# Patient Record
Sex: Female | Born: 1980
Health system: Southern US, Community
[De-identification: ages and names within clinical notes are randomized; demographics above are authoritative.]

## PROBLEM LIST (undated history)

## (undated) ENCOUNTER — Inpatient Hospital Stay (HOSPITAL_COMMUNITY): Payer: Self-pay

## (undated) DIAGNOSIS — H50811 Duane's syndrome, right eye: Secondary | ICD-10-CM

## (undated) DIAGNOSIS — E669 Obesity, unspecified: Secondary | ICD-10-CM

## (undated) DIAGNOSIS — F419 Anxiety disorder, unspecified: Secondary | ICD-10-CM

## (undated) DIAGNOSIS — M26629 Arthralgia of temporomandibular joint, unspecified side: Secondary | ICD-10-CM

## (undated) DIAGNOSIS — D72829 Elevated white blood cell count, unspecified: Secondary | ICD-10-CM

## (undated) DIAGNOSIS — F329 Major depressive disorder, single episode, unspecified: Secondary | ICD-10-CM

## (undated) DIAGNOSIS — D134 Benign neoplasm of liver: Secondary | ICD-10-CM

## (undated) DIAGNOSIS — K219 Gastro-esophageal reflux disease without esophagitis: Secondary | ICD-10-CM

## (undated) DIAGNOSIS — M654 Radial styloid tenosynovitis [de Quervain]: Secondary | ICD-10-CM

## (undated) DIAGNOSIS — F32A Depression, unspecified: Secondary | ICD-10-CM

## (undated) HISTORY — DX: Anxiety disorder, unspecified: F41.9

## (undated) HISTORY — DX: Obesity, unspecified: E66.9

## (undated) HISTORY — PX: WISDOM TOOTH EXTRACTION: SHX21

## (undated) HISTORY — DX: Elevated white blood cell count, unspecified: D72.829

## (undated) HISTORY — PX: STRABISMUS SURGERY: SHX218

## (undated) HISTORY — DX: Benign neoplasm of liver: D13.4

---
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
## 2002-03-07 ENCOUNTER — Other Ambulatory Visit: Admission: RE | Admit: 2002-03-07 | Discharge: 2002-03-07 | Payer: Self-pay | Admitting: Obstetrics and Gynecology

## 2003-03-27 ENCOUNTER — Other Ambulatory Visit: Admission: RE | Admit: 2003-03-27 | Discharge: 2003-03-27 | Payer: Self-pay | Admitting: Obstetrics and Gynecology

## 2004-04-08 ENCOUNTER — Other Ambulatory Visit: Admission: RE | Admit: 2004-04-08 | Discharge: 2004-04-08 | Payer: Self-pay | Admitting: Obstetrics and Gynecology

## 2005-06-27 ENCOUNTER — Emergency Department (HOSPITAL_COMMUNITY): Admission: EM | Admit: 2005-06-27 | Discharge: 2005-06-27 | Payer: Self-pay | Admitting: *Deleted

## 2005-07-01 ENCOUNTER — Other Ambulatory Visit: Admission: RE | Admit: 2005-07-01 | Discharge: 2005-07-01 | Payer: Self-pay | Admitting: Obstetrics and Gynecology

## 2006-02-07 ENCOUNTER — Emergency Department (HOSPITAL_COMMUNITY): Admission: EM | Admit: 2006-02-07 | Discharge: 2006-02-07 | Payer: Self-pay | Admitting: Family Medicine

## 2006-09-01 ENCOUNTER — Emergency Department (HOSPITAL_COMMUNITY): Admission: EM | Admit: 2006-09-01 | Discharge: 2006-09-01 | Payer: Self-pay | Admitting: Family Medicine

## 2006-09-10 ENCOUNTER — Other Ambulatory Visit: Admission: RE | Admit: 2006-09-10 | Discharge: 2006-09-10 | Payer: Self-pay | Admitting: Obstetrics & Gynecology

## 2007-09-01 HISTORY — PX: UPPER GASTROINTESTINAL ENDOSCOPY: SHX188

## 2007-11-24 ENCOUNTER — Other Ambulatory Visit: Admission: RE | Admit: 2007-11-24 | Discharge: 2007-11-24 | Payer: Self-pay | Admitting: Obstetrics and Gynecology

## 2011-09-16 ENCOUNTER — Encounter: Payer: Self-pay | Admitting: Internal Medicine

## 2011-09-17 ENCOUNTER — Institutional Professional Consult (permissible substitution): Payer: Self-pay | Admitting: Internal Medicine

## 2011-09-17 ENCOUNTER — Encounter: Payer: Self-pay | Admitting: Internal Medicine

## 2011-09-17 ENCOUNTER — Ambulatory Visit (INDEPENDENT_AMBULATORY_CARE_PROVIDER_SITE_OTHER): Payer: 59 | Admitting: Internal Medicine

## 2011-09-17 VITALS — BP 124/80 | HR 118 | Temp 98.1°F | Ht 62.0 in | Wt 240.8 lb

## 2011-09-17 DIAGNOSIS — J310 Chronic rhinitis: Secondary | ICD-10-CM

## 2011-09-17 DIAGNOSIS — R05 Cough: Secondary | ICD-10-CM

## 2011-09-17 MED ORDER — PREDNISONE (PAK) 10 MG PO TABS: ORAL_TABLET | ORAL | Status: AC

## 2011-09-17 MED ORDER — TRAMADOL HCL 50 MG PO TABS: ORAL_TABLET | ORAL | Status: AC

## 2011-09-17 NOTE — Patient Instructions (Addendum)
The key to effective treatment for your cough is eliminating the non-stop cycle of cough you're stuck in long enough to let your airway heal completely and then see if there is anything still making you cough once you stop the cough suppression, but this should take no more than 5 days to figuure out  First take delsym two tsp every 12 hours and supplement if needed with  tramadol 50 mg up to 2 every 4 hours to suppress the urge to cough. Swallowing water or using ice chips/non mint and menthol containing candies (such as lifesavers or sugarless jolly ranchers) are also effective.  You should rest your voice and avoid activities that you know make you cough.  Once you have eliminated the cough for 3 straight days try reducing the tramadol first,  then the delsym as tolerated.    Try prilosec 20mg   Take x 2  30-60 min before first meal of the day and Pepcid 20 mg one bedtime until cough is completely gone for at least a week without the need for cough suppression  I think of reflux for chronic cough like I do oxygen for fire (doesn't cause the fire but once you get the oxygen suppressed it usually goes away regardless of the exact cause).  GERD (REFLUX)  is an extremely common cause of respiratory symptoms, many times with no significant heartburn at all.    It can be treated with medication, but also with lifestyle changes including avoidance of late meals, excessive alcohol, smoking cessation, and avoid fatty foods, chocolate, peppermint, colas, red wine, and acidic juices such as orange juice.  NO MINT OR MENTHOL PRODUCTS SO NO COUGH DROPS  USE SUGARLESS CANDY INSTEAD (jolley ranchers or Stover's)  NO OIL BASED VITAMINS - use powdered substitutes.    Prednisone 10 mg take  4 each am x 2 days,   2 each am x 2 days,  1 each am x2days and stop     If not better in 2 weeks the next step is a sinus CT

## 2011-09-17 NOTE — Progress Notes (Signed)
  Subjective:    Patient ID: Melissa May, female    DOB: 10/14/1980, 31 y.o.   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  09/17/2011 1st pulmonary eval cc acute onset 08/2011  Mostly dry cough  - no diurnal  pattern to cough vs husband who says worse sev hours p supper until 4 am but doesn't awaken her. Assoc with nasal congestion and sneezing, no purulent sputum.  Responded to prednisone maybe 50% but caused shaking p 9 days so stopped.    She says she's sleeping ok without  need for noct saba. Also denies any obvious fluctuation of symptoms with weather or environmental changes or other aggravating or alleviating factors except as outlined above - still has dogs inside the house and bedroom despite Dr Kathyrn Lass recs.  .  Review of Systems  Constitutional: Negative for fever, chills and unexpected weight change.  HENT: Positive for ear pain, congestion and sneezing. Negative for nosebleeds, sore throat, rhinorrhea, trouble swallowing, dental problem, voice change, postnasal drip and sinus pressure.   Eyes: Negative for visual disturbance.  Respiratory: Positive for cough and shortness of breath. Negative for choking.   Cardiovascular: Positive for chest pain. Negative for leg swelling.  Gastrointestinal: Negative for vomiting, abdominal pain and diarrhea.  Genitourinary: Negative for difficulty urinating.  Musculoskeletal: Negative for arthralgias.  Skin: Negative for rash.  Neurological: Positive for headaches. Negative for tremors and syncope.  Hematological: Does not  bruise/bleed easily.       Objective:   Physical Exam  amb obese moderately anxious wf nad  Wt 240 09/17/11   HEENT: nl dentition, turbinates, and orophanx. Nl external ear canals without cough reflex   NECK :  without JVD/Nodes/TM/ nl carotid upstrokes bilaterally   LUNGS: no acc muscle use, clear to A and P bilaterally without cough on insp or exp maneuvers   CV:  RRR  no s3 or murmur or increase in P2, no edema   ABD:  soft and nontender with nl excursion in the supine position. No bruits or organomegaly, bowel sounds nl  MS:  warm without deformities, calf tenderness, cyanosis or clubbing  SKIN: warm and dry without lesions    NEURO:  alert, approp, no deficits         Assessment & Plan:

## 2011-09-18 DIAGNOSIS — J310 Chronic rhinitis: Secondary | ICD-10-CM | POA: Insufficient documentation

## 2011-09-18 NOTE — Assessment & Plan Note (Signed)
The most common causes of chronic cough in immunocompetent adults include the following: upper airway cough syndrome (UACS), previously referred to as postnasal drip syndrome (PNDS), which is caused by variety of rhinosinus conditions; (2) asthma; (3) GERD; (4) chronic bronchitis from cigarette smoking or other inhaled environmental irritants; (5) nonasthmatic eosinophilic bronchitis; and (6) bronchiectasis.   These conditions, singly or in combination, have accounted for up to 94% of the causes of chronic cough in prospective studies.   Other conditions have constituted no >6% of the causes in prospective studies These have included bronchogenic carcinoma, chronic interstitial pneumonia, sarcoidosis, left ventricular failure, ACEI-induced cough, and aspiration from a condition associated with pharyngeal dysfunction.   This is most c/w  Classic Upper airway cough syndrome, so named because it's frequently impossible to sort out how much is  CR/sinusitis with freq throat clearing (which can be related to primary GERD)   vs  causing  secondary (" extra esophageal")  GERD from wide swings in gastric pressure that occur with throat clearing, often  promoting self use of mint and menthol lozenges that reduce the lower esophageal sphincter tone and exacerbate the problem further in a cyclical fashion.   These are the same pts who not infrequently have failed to tolerate ace inhibitors,  dry powder inhalers or biphosphonates or report having reflux symptoms that don't respond to standard doses of PPI , and are easily confused as having aecopd or asthma flares,   For now max rx for gerd/ cyclical cough then regroup

## 2011-09-18 NOTE — Assessment & Plan Note (Signed)
Advised not appropriate to have dogs in house esp in bedroom or to take singulair prn (needs to either prove it works or stop it completely)

## 2012-05-20 DIAGNOSIS — E669 Obesity, unspecified: Secondary | ICD-10-CM | POA: Insufficient documentation

## 2012-11-24 ENCOUNTER — Ambulatory Visit (INDEPENDENT_AMBULATORY_CARE_PROVIDER_SITE_OTHER): Payer: 59 | Admitting: Family Medicine

## 2012-11-24 VITALS — BP 128/92 | HR 86 | Temp 98.2°F | Resp 16 | Ht 63.0 in | Wt 238.0 lb

## 2012-11-24 DIAGNOSIS — R21 Rash and other nonspecific skin eruption: Secondary | ICD-10-CM

## 2012-11-24 DIAGNOSIS — L259 Unspecified contact dermatitis, unspecified cause: Secondary | ICD-10-CM

## 2012-11-24 DIAGNOSIS — L309 Dermatitis, unspecified: Secondary | ICD-10-CM

## 2012-11-24 LAB — POCT SKIN KOH: Skin KOH, POC: NEGATIVE

## 2012-11-24 MED ORDER — BETAMETHASONE DIPROPIONATE 0.05 % EX CREA: TOPICAL_CREAM | Freq: Two times a day (BID) | CUTANEOUS | Status: DC

## 2012-11-24 NOTE — Progress Notes (Signed)
Subjective: Patient has had a rash in her right leg for about 3 weeks. It is a fairly well-circumscribed area about 3 seems in diameter. It does not itch a lot. Just very apparent. She's going on a cruise next week and would like it to be gone.  Objective: Erythematous area, well-defined, 3 seems in diameter, right mid shin. It does not have a very active looking for her. It is fairly homogenous. No other lesions on her.  Assessment: Eczematoid dermatitis  Plan: Betamethasone topical. Return if not improved

## 2012-11-24 NOTE — Patient Instructions (Signed)
Return if it is not improving.  Use the cream twice daily.

## 2012-12-19 ENCOUNTER — Telehealth: Payer: Self-pay | Admitting: Nurse Practitioner

## 2012-12-19 NOTE — Telephone Encounter (Signed)
LMTCB  aa 

## 2012-12-19 NOTE — Telephone Encounter (Signed)
Yes lets have pt come in for OV she is seeing infertility specialist and Dr. Hyacinth Meeker - so I would defer her irregular bleeding for Dr. Hyacinth Meeker to evaluate.

## 2012-12-19 NOTE — Telephone Encounter (Signed)
Advised patient of Patty's recommendation for office visit.  Patient states she stopped fertility treatements last summer. Agrees to OV but needs early AM or late PM.  Appt scheduled with Dr Farrel Gobble on Wed at 1115.  Best available appt for patient schedule.

## 2012-12-19 NOTE — Telephone Encounter (Signed)
Spoke with pt who is having her third period in 30 days. Had period from March 22 thru 26. Had another from April 8-12, and started again on April 18. Pt not on birth control and has not been for over a year. Please advise. OV?

## 2012-12-21 ENCOUNTER — Encounter: Payer: Self-pay | Admitting: Gynecology

## 2012-12-21 ENCOUNTER — Ambulatory Visit (INDEPENDENT_AMBULATORY_CARE_PROVIDER_SITE_OTHER): Payer: 59 | Admitting: Gynecology

## 2012-12-21 VITALS — BP 120/76 | HR 82 | Resp 18 | Ht 62.0 in | Wt 240.0 lb

## 2012-12-21 DIAGNOSIS — N970 Female infertility associated with anovulation: Secondary | ICD-10-CM | POA: Insufficient documentation

## 2012-12-21 DIAGNOSIS — N920 Excessive and frequent menstruation with regular cycle: Secondary | ICD-10-CM | POA: Insufficient documentation

## 2012-12-21 LAB — TSH: TSH: 1.942 u[IU]/mL (ref 0.350–4.500)

## 2012-12-21 LAB — PROLACTIN: Prolactin: 4.7 ng/mL

## 2012-12-21 LAB — FSH/LH
FSH: 8.2 m[IU]/mL
LH: 2.9 m[IU]/mL

## 2012-12-21 LAB — HCG, SERUM, QUALITATIVE: Preg, Serum: NEGATIVE

## 2012-12-21 NOTE — Patient Instructions (Signed)
Use condoms until sonohysterogram/biopsy doneDysfunctional Uterine Bleeding Normally, menstrual periods begin between ages 32 to 34 in young women. A normal menstrual cycle/period may begin every 23 days up to 35 days and lasts from 1 to 7 days. Around 12 to 14 days before your menstrual period starts, ovulation (ovary produces an egg) occurs. When counting the time between menstrual periods, count from the first day of bleeding of the previous period to the first day of bleeding of the next period. Dysfunctional (abnormal) uterine bleeding is bleeding that is different from a normal menstrual period. Your periods may come earlier or later than usual. They may be lighter, have blood clots or be heavier. You may have bleeding between periods, or you may skip one period or more. You may have bleeding after sexual intercourse, bleeding after menopause, or no menstrual period. CAUSES   Pregnancy (normal, miscarriage, tubal).  IUDs (intrauterine device, birth control).  Birth control pills.  Hormone treatment.  Menopause.  Infection of the cervix.  Blood clotting problems.  Infection of the inside lining of the uterus.  Endometriosis, inside lining of the uterus growing in the pelvis and other female organs.  Adhesions (scar tissue) inside the uterus.  Obesity or severe weight loss.  Uterine polyps inside the uterus.  Cancer of the vagina, cervix, or uterus.  Ovarian cysts or polycystic ovary syndrome.  Medical problems (diabetes, thyroid disease).  Uterine fibroids (noncancerous tumor).  Problems with your female hormones.  Endometrial hyperplasia, very thick lining and enlarged cells inside of the uterus.  Medicines that interfere with ovulation.  Radiation to the pelvis or abdomen.  Chemotherapy. DIAGNOSIS   Your doctor will discuss the history of your menstrual periods, medicines you are taking, changes in your weight, stress in your life, and any medical problems you  may have.  Your doctor will do a physical and pelvic examination.  Your doctor may want to perform certain tests to make a diagnosis, such as:  Pap test.  Blood tests.  Cultures for infection.  CT scan.  Ultrasound.  Hysteroscopy.  Laparoscopy.  MRI.  Hysterosalpingography.  D and C.  Endometrial biopsy. TREATMENT  Treatment will depend on the cause of the dysfunctional uterine bleeding (DUB). Treatment may include:  Observing your menstrual periods for a couple of months.  Prescribing medicines for medical problems, including:  Antibiotics.  Hormones.  Birth control pills.  Removing an IUD (intrauterine device, birth control).  Surgery:  D and C (scrape and remove tissue from inside the uterus).  Laparoscopy (examine inside the abdomen with a lighted tube).  Uterine ablation (destroy lining of the uterus with electrical current, laser, heat, or freezing).  Hysteroscopy (examine cervix and uterus with a lighted tube).  Hysterectomy (remove the uterus). HOME CARE INSTRUCTIONS   If medicines were prescribed, take exactly as directed. Do not change or switch medicines without consulting your caregiver.  Long term heavy bleeding may result in iron deficiency. Your caregiver may have prescribed iron pills. They help replace the iron that your body lost from heavy bleeding. Take exactly as directed.  Do not take aspirin or medicines that contain aspirin one week before or during your menstrual period. Aspirin may make the bleeding worse.  If you need to change your sanitary pad or tampon more than once every 2 hours, stay in bed with your feet elevated and a cold pack on your lower abdomen. Rest as much as possible, until the bleeding stops or slows down.  Eat well-balanced meals. Eat foods  high in iron. Examples are:  Leafy green vegetables.  Whole-grain breads and cereals.  Eggs.  Meat.  Liver.  Do not try to lose weight until the abnormal  bleeding has stopped and your blood iron level is back to normal. Do not lift more than ten pounds or do strenuous activities when you are bleeding.  For a couple of months, make note on your calendar, marking the start and ending of your period, and the type of bleeding (light, medium, heavy, spotting, clots or missed periods). This is for your caregiver to better evaluate your problem. SEEK MEDICAL CARE IF:   You develop nausea (feeling sick to your stomach) and vomiting, dizziness, or diarrhea while you are taking your medicine.  You are getting lightheaded or weak.  You have any problems that may be related to the medicine you are taking.  You develop pain with your DUB.  You want to remove your IUD.  You want to stop or change your birth control pills or hormones.  You have any type of abnormal bleeding mentioned above.  You are over 4 years old and have not had a menstrual period yet.  You are 32 years old and you are still having menstrual periods.  You have any of the symptoms mentioned above.  You develop a rash. SEEK IMMEDIATE MEDICAL CARE IF:   An oral temperature above 102 F (38.9 C) develops.  You develop chills.  You are changing your sanitary pad or tampon more than once an hour.  You develop abdominal pain.  You pass out or faint. Document Released: 08/14/2000 Document Revised: 11/09/2011 Document Reviewed: 07/16/2009 [facility] Patient Information 2013 [place], [place].

## 2012-12-21 NOTE — Progress Notes (Signed)
Subjective:     Patient ID: Melissa May, female   DOB: 14-Jun-1981, 32 y.o.   MRN: [id number]  HPI Comments: Pt here to discuss hypermenorrhea.  Pt was undergoing infertility evaluation at [facility], she underwent clomid cycles and then Femara cycles, last cycle induced October 2013.  Pt reports regular cycles in November through March.  Pt reports thereafter she had a cycle March 1-5, then March 22-26, April 8-12, April 18-current.  Pt reports spotting in between cycles.  Pt denies post coital bleeding.  Pt reports [facility] felt she is not PCOS, but perhaps prediabetic. Currently pt and husband are taking a break.      Review of Systems  Endocrine: Negative for cold intolerance, heat intolerance, polydipsia and polyuria.  All other systems reviewed and are negative.       Objective:   Physical Exam  Constitutional: She appears well-developed and well-nourished.  Abdominal: Soft.  obese  Pelvic exam:  VULVA: normal appearing vulva with no masses, tenderness or lesions  VAGINA: menstrum, no malodor,  CERVIX: normal appearing cervix without lesions,  UTERUS: uterus is normal size, shape, consistency and nontender,  ADNEXA: no masses.      Assessment:     Hypermenorrhea, history of infertility     Plan:     We reviewed her records- recommend sonohysterogram, poss EMB TSH, PRL, QUAL, FSH/LH to be done Pt instructed to use condoms until u/s evaluation, repeat urine pregnancy at that vist Recommend Pt's husband give another sperm sample to rule out varicocele-2% normal morphology Pt given emotional support

## 2013-01-09 ENCOUNTER — Telehealth: Payer: Self-pay | Admitting: Obstetrics & Gynecology

## 2013-01-09 NOTE — Telephone Encounter (Signed)
Pt cancelled ultrasound for Tuesday and would like to schedule for the 20th if possible.

## 2013-01-10 ENCOUNTER — Other Ambulatory Visit: Payer: 59 | Admitting: Gynecology

## 2013-01-10 ENCOUNTER — Other Ambulatory Visit: Payer: 59

## 2013-01-11 NOTE — Telephone Encounter (Signed)
LMTCB to resched appt.

## 2013-01-16 ENCOUNTER — Telehealth: Payer: Self-pay | Admitting: Obstetrics and Gynecology

## 2013-01-16 NOTE — Telephone Encounter (Signed)
Patient actually has two accounts.  Will have charts merged.  Note to Dr Farrel Gobble

## 2013-01-16 NOTE — Telephone Encounter (Signed)
LM regarding changed date for [facility], asked to call back

## 2013-01-16 NOTE — Telephone Encounter (Signed)
See next telephone call  

## 2013-01-16 NOTE — Telephone Encounter (Signed)
Patient states she is not able to get coverage for [employer] tomm and want to schedule for 01-24-13.  LMP 01-05-13 and advised patient  That this would be too late in her cycle for this test.  Patient is insistent that she has not had intercourse and no chance of pregnancy and that this test is being done for frequent irregular periods. I asked her if she was still attempting pregnancy ([facility] referral in old chart) and she states "it is a long story"  Advised patient I could not schedule SHGM past mid-cycle without checking with MD so will have to call her back.  * I can not find any notation that we ordered this.   Paper chart to your office.

## 2013-01-16 NOTE — Telephone Encounter (Signed)
Patient returned phone call to [facility].

## 2013-01-17 ENCOUNTER — Encounter: Payer: Self-pay | Admitting: Gynecology

## 2013-01-17 ENCOUNTER — Telehealth: Payer: Self-pay | Admitting: Gynecology

## 2013-01-17 NOTE — Telephone Encounter (Signed)
Patient called in reference to hysterectomy.

## 2013-01-19 NOTE — Telephone Encounter (Signed)
Routed to triage -Melissa May

## 2013-01-24 ENCOUNTER — Other Ambulatory Visit: Payer: Self-pay | Admitting: Gynecology

## 2013-01-24 DIAGNOSIS — N920 Excessive and frequent menstruation with regular cycle: Secondary | ICD-10-CM

## 2013-01-31 ENCOUNTER — Ambulatory Visit (INDEPENDENT_AMBULATORY_CARE_PROVIDER_SITE_OTHER): Payer: 59 | Admitting: Gynecology

## 2013-01-31 ENCOUNTER — Ambulatory Visit (INDEPENDENT_AMBULATORY_CARE_PROVIDER_SITE_OTHER): Payer: 59

## 2013-01-31 DIAGNOSIS — N949 Unspecified condition associated with female genital organs and menstrual cycle: Secondary | ICD-10-CM

## 2013-01-31 DIAGNOSIS — N938 Other specified abnormal uterine and vaginal bleeding: Secondary | ICD-10-CM

## 2013-01-31 DIAGNOSIS — N8300 Follicular cyst of ovary, unspecified side: Secondary | ICD-10-CM

## 2013-01-31 DIAGNOSIS — N920 Excessive and frequent menstruation with regular cycle: Secondary | ICD-10-CM

## 2013-01-31 MED ORDER — NORETHINDRONE-ETH ESTRADIOL 0.4-35 MG-MCG PO TABS: 1.0000 | ORAL_TABLET | Freq: Every day | ORAL | Status: DC

## 2013-01-31 NOTE — Telephone Encounter (Signed)
Left message for patient to return call to office to reschedule ultra sound and sonohysterography  due to her bleeding . Per Dr. Farrel Gobble reschedule for June 10th. sue

## 2013-01-31 NOTE — Progress Notes (Signed)
  Pt here for u/s with [facility] for DUB, the pt reports menometrorrhagia and is greatly distressed regarding this, she had started spotting this am but not enough for a tampon, she reports this is typical for her.  She and her husband have been actively trying to conceive for 2y without success and she is feeling pressure as she is going to be 32yo soon. She reports they have not had sex in over a month in anticipation of this test today, in addition, her husband is going thru training at work which makes him unavailable.  Pt recently started on Prozac by another provider and is feeling better but reports less sex drive.   Her LMP is questionable because of the frequent spotting that she has. We reviewed her u/s images as well as in real time, of note is she is indeed D12? Her endometrium is weakly trilayered and 4.84mm Pt informed polyp unlikely with such thin lining but would like to proceed with SHG. Consent obtained, cevix cleansed with betadine insemination catheter splaced, walls gently distended, no intracavitary defects noted. We reviewed the patient's chart regarding fertility, labs were done at [facility] but the results are NA.  Pt states that she was started on metformin but does not have PCOS.  I explained that her labs would help make that determination, but while she may have ovulated her other follicles appear larger than expected.  We discussed the impact of PCOS both on ovulation induction as well as the effects of elevated LH on CVD.  We have asked her to release labs to [place], and she is agreeable.  Her husband's SA was abnormal and in the past we had suggested he be evaluated by Urology as he has only 2% normal morphology with a concentration of 21m.  We discussed the impact of different stimulating agents on the endometrium and multiple gestations.  Regarding her break-thru bleeding, there were no gross defects noted and it could be related to poor estrogen from poor ovulation,  We suggested  trying to increase her lining with a pill while her husband is being evaluated.  If indeed his sperm remains at low levels of normal morpholgy, they might benefit from IVF/ICSI and her stimulation for that would correct her poor endometrial response. All questions were addressed Length of consultation beyond u/s, SHG 49m

## 2013-01-31 NOTE — Telephone Encounter (Signed)
Patient called and talked with Dr. Farrel Gobble concerning appt. Today.  Patient will keep appt. Today for ultrasound and sonahystagram. States she is not bleeding enough to even have to use tampon.

## 2013-01-31 NOTE — Telephone Encounter (Signed)
Patient called and wanted to know if she could still do her procedure because she has started bleeding

## 2013-01-31 NOTE — Telephone Encounter (Signed)
Left message of need to return our call to our office to reschedule appt. For next week.

## 2013-01-31 NOTE — Telephone Encounter (Signed)
Patient has a procedure scheduled for today and has started bleeding . Needs to know if she needs to still come in or if she needs to reschedule.

## 2013-04-14 ENCOUNTER — Encounter: Payer: Self-pay | Admitting: Gynecology

## 2013-04-14 ENCOUNTER — Ambulatory Visit (INDEPENDENT_AMBULATORY_CARE_PROVIDER_SITE_OTHER): Payer: 59 | Admitting: Gynecology

## 2013-04-14 VITALS — BP 114/66 | HR 64 | Resp 12 | Ht 62.0 in | Wt 238.0 lb

## 2013-04-14 DIAGNOSIS — Z01419 Encounter for gynecological examination (general) (routine) without abnormal findings: Secondary | ICD-10-CM

## 2013-04-14 DIAGNOSIS — Z124 Encounter for screening for malignant neoplasm of cervix: Secondary | ICD-10-CM

## 2013-04-14 DIAGNOSIS — N949 Unspecified condition associated with female genital organs and menstrual cycle: Secondary | ICD-10-CM

## 2013-04-14 DIAGNOSIS — N469 Male infertility, unspecified: Secondary | ICD-10-CM

## 2013-04-14 DIAGNOSIS — N970 Female infertility associated with anovulation: Secondary | ICD-10-CM

## 2013-04-14 DIAGNOSIS — N938 Other specified abnormal uterine and vaginal bleeding: Secondary | ICD-10-CM

## 2013-04-14 MED ORDER — NORETHINDRONE-ETH ESTRADIOL 0.4-35 MG-MCG PO TABS: 1.0000 | ORAL_TABLET | Freq: Every day | ORAL | Status: DC

## 2013-04-14 NOTE — Progress Notes (Signed)
Pt here for issues regarding fertility.  32 y.o. Married Caucasian female   G0P0 here for annual exam. Pt is currently sexually active.   Pt was undergoing infertility evaluation at [facility], she underwent clomid cycles and then Femara cycles, last cycle induced October 2013. Pt reports regular cycles in November through March. Pt reports thereafter she had a cycle March 1-5, then March 22-26, April 8-12, April 18-current. Pt reports spotting in between cycles. Pt denies post coital bleeding. Pt reports [facility] felt she is not PCOS, but perhaps prediabetic. She had an u/s and SHG 6/5 for DUB Pt's husband give another sperm sample to rule out varicocele-2% normal morphology, was recently-evaluated by urology-4%normal, low Testosterone, to be started on clomid, records pending   Patient's last menstrual period was 04/01/2013.          Sexually active: yes  The current method of family planning is OCP (estrogen/progesterone).    Exercising: yes Last pap: 03/10/12- Negative Alcohol: occasionally once/month Tobacco: no BSE: No      Health Maintenance  Topic Date Due  . Pap Smear  03/02/2001  . Tetanus/tdap  03/02/2002  . Influenza Vaccine  05/01/2013    Family History  Problem Relation Age of Onset  . Cancer Father   . Hyperlipidemia    . Hypertension    . Asthma Paternal Grandmother   . Diabetes Paternal Grandmother   . Allergies Brother   . Kidney cancer Father   . Hypertension Father   . Arthritis Mother   . Hypertension Maternal Grandfather     Patient Active Problem List   Diagnosis Date Noted  . Infertility associated with anovulation 12/21/2012  . Hypermenorrhea 12/21/2012  . Chronic rhinitis 09/18/2011  . Cough 09/17/2011    Past Medical History  Diagnosis Date  . Viral infection   . Pharyngitis   . Asthma   . Multiple allergies   . Allergy   . Asthma   . Abnormal uterine bleeding     2 weeks of bleeding in March and April of 2014  . Dysmenorrhea      Past Surgical History  Procedure Laterality Date  . Upper gastrointestinal endoscopy  2011    Allergies: Lexapro oral solution  Current Outpatient Prescriptions  Medication Sig Dispense Refill  . cetirizine (ZYRTEC) 10 MG tablet Take 10 mg by mouth daily.      . famotidine (PEPCID) 20 MG tablet Take 20 mg by mouth daily.      Marland Kitchen FLUoxetine (PROZAC) 40 MG capsule Take 40 mg by mouth daily.      . norethindrone-ethinyl estradiol (OVCON-35,BALZIVA,BRIELLYN) 0.4-35 MG-MCG tablet Take 1 tablet by mouth daily.  1 Package  12  . omeprazole (PRILOSEC) 20 MG capsule Take 20 mg by mouth daily.      . Prenatal Multivit-Min-Fe-FA (PRE-NATAL PO) Take by mouth daily.       No current facility-administered medications for this visit.    ROS: Pertinent items are noted in HPI.  Exam:    BP 114/66  Pulse 64  Resp 12  Ht 5\' 2"  (1.575 m)  Wt 238 lb (107.956 kg)  BMI 43.52 kg/m2  LMP 04/01/2013 Weight change: @WEIGHTCHANGE @ Last 3 height recordings:  Ht Readings from Last 3 Encounters:  04/14/13 5\' 2"  (1.575 m)  12/21/12 5\' 2"  (1.575 m)  11/24/12 5\' 3"  (1.6 m)   General appearance: alert, cooperative and appears stated age Head: Normocephalic, without obvious abnormality, atraumatic Neck: no adenopathy, no carotid bruit, no JVD, supple, symmetrical,  trachea midline and thyroid not enlarged, symmetric, no tenderness/mass/nodules Lungs: clear to auscultation bilaterally Breasts: normal appearance, no masses or tenderness Heart: regular rate and rhythm, S1, S2 normal, no murmur, click, rub or gallop Abdomen: soft, non-tender; bowel sounds normal; no masses,  no organomegaly Extremities: extremities normal, atraumatic, no cyanosis or edema Skin: Skin color, texture, turgor normal. No rashes or lesions Lymph nodes: Cervical, supraclavicular, and axillary nodes normal. no inguinal nodes palpated Neurologic: Grossly normal   Pelvic: External genitalia:  no lesions              Urethra:  normal appearing urethra with no masses, tenderness or lesions              Bartholins and Skenes: normal                 Vagina: normal appearing vagina with normal color and discharge, no lesions              Cervix: normal appearance              Pap taken: yes        Bimanual Exam:  Uterus:  uterus is normal size, shape, consistency and nontender                                      Adnexa:    no masses                                      Rectovaginal: Confirms                                      Anus:  normal sphincter tone, no lesions  A: well woman Contraceptive management     P: pap smear with HPV counseled on breast self exam Discussed low sperm count and effects on fertility, would like to see the initial and follow up SA after clomid.  Briefly discussed options for stimulation, pt with poor endometrial response to clomid, might  Benefit from IUI by REI or ICSI.  Pt asked to call to confirm we received SA results.  Husband does not have varicocele.  Would like more formal discussion after results available and seh agrees. DUB improving on OCP will continue until we determine stimulation  PNV suggested Additional 90m spend discussing fertility return annually or prn   An After Visit Summary was printed and given to the patient.

## 2013-04-14 NOTE — Patient Instructions (Signed)

## 2013-04-18 LAB — IPS PAP TEST WITH HPV

## 2013-05-14 ENCOUNTER — Encounter (HOSPITAL_COMMUNITY): Payer: Self-pay | Admitting: *Deleted

## 2013-05-14 ENCOUNTER — Emergency Department (HOSPITAL_COMMUNITY)
Admission: EM | Admit: 2013-05-14 | Discharge: 2013-05-14 | Disposition: A | Discharge status: Home / Self Care | Payer: Commercial Managed Care - PPO | Source: Home / Self Care | Attending: Family Medicine | Admitting: Family Medicine

## 2013-05-14 DIAGNOSIS — J029 Acute pharyngitis, unspecified: Secondary | ICD-10-CM

## 2013-05-14 HISTORY — DX: Depression, unspecified: F32.A

## 2013-05-14 HISTORY — DX: Gastro-esophageal reflux disease without esophagitis: K21.9

## 2013-05-14 HISTORY — DX: Major depressive disorder, single episode, unspecified: F32.9

## 2013-05-14 LAB — POCT RAPID STREP A: Streptococcus, Group A Screen (Direct): NEGATIVE

## 2013-05-14 MED ORDER — PREDNISONE 20 MG PO TABS: 20.0000 mg | ORAL_TABLET | Freq: Every day | ORAL | Status: DC

## 2013-05-14 NOTE — ED Notes (Signed)
C/O sore throat, slight cough, watery ear drainage, swimmy feeling in head since Friday.  Sore throat becoming worse - "feels like I've swallowed razor blades".  Has tried Tyl cold & Congestion.

## 2013-05-14 NOTE — ED Provider Notes (Signed)
Melissa May is a 32 y.o. female who presents to Urgent Care today for sore throat and congestion present for the last 2 days. It has worsened today. She's tried over-the-counter Tylenol cold medications which have helped only a little. She denies any fevers or chills nausea vomiting diarrhea or trouble breathing. She feels well otherwise. The sore throat is quite bothersome.    Past Medical History  Diagnosis Date  . Viral infection   . Pharyngitis   . Asthma   . Multiple allergies   . Allergy   . Asthma   . Abnormal uterine bleeding     2 weeks of bleeding in March and April of 2014  . Dysmenorrhea   . GERD (gastroesophageal reflux disease)   . Depression    History  Substance Use Topics  . Smoking status: Never Smoker   . Smokeless tobacco: Never Used  . Alcohol Use: 1.2 oz/week    1 Glasses of wine, 1 Cans of beer per week     Comment: occasionally   ROS as above Medications reviewed. No current facility-administered medications for this encounter.   Current Outpatient Prescriptions  Medication Sig Dispense Refill  . cetirizine (ZYRTEC) 10 MG tablet Take 10 mg by mouth daily.      . famotidine (PEPCID) 20 MG tablet Take 20 mg by mouth daily.      Marland Kitchen FLUoxetine (PROZAC) 40 MG capsule Take 40 mg by mouth daily.      . norethindrone-ethinyl estradiol (OVCON-35,BALZIVA,BRIELLYN) 0.4-35 MG-MCG tablet Take 1 tablet by mouth daily.  3 Package  4  . predniSONE (DELTASONE) 20 MG tablet Take 1 tablet (20 mg total) by mouth daily.  5 tablet  0    Exam:  BP 131/86  Pulse 90  Temp(Src) 99.1 F (37.3 C) (Oral)  Resp 18  SpO2 96%  LMP 04/23/2013 Gen: Well NAD HEENT: EOMI,  MMM, normal-appearing tympanic membranes bilaterally. Posterior for his mildly erythematous.  Lungs: CTABL Nl WOB Heart: RRR no MRG Abd: NABS, NT, ND Exts: Non edematous BL  LE, warm and well perfused.   Results for orders placed during the hospital encounter of 05/14/13 (from the past 24 hour(s))  POCT  RAPID STREP A ([facility] ONLY)     Status: None   Collection Time    05/14/13  6:54 PM      Result Value Range   Streptococcus, Group A Screen (Direct) NEGATIVE  NEGATIVE   No results found.  Assessment and Plan: 32 y.o. female with viral pharyngitis. Patient is very symptomatic. Will use a short course of low dose prednisone for symptom relief. Additionally Tylenol as needed for pain. Discussed warning signs or symptoms. Please see discharge instructions. Patient expresses understanding. F/u PRN     Rodolph Bong, MD 05/14/13 1900

## 2013-05-15 ENCOUNTER — Telehealth: Payer: Self-pay | Admitting: Gynecology

## 2013-05-15 NOTE — Telephone Encounter (Signed)
Patient is wanting to know if we have received her husband semen results.

## 2013-05-15 NOTE — Telephone Encounter (Signed)
LMTCB

## 2013-05-16 ENCOUNTER — Ambulatory Visit (INDEPENDENT_AMBULATORY_CARE_PROVIDER_SITE_OTHER): Payer: 59 | Admitting: Physician Assistant

## 2013-05-16 VITALS — BP 122/84 | HR 87 | Temp 99.0°F | Resp 17 | Ht 63.0 in | Wt 236.0 lb

## 2013-05-16 DIAGNOSIS — R059 Cough, unspecified: Secondary | ICD-10-CM

## 2013-05-16 DIAGNOSIS — R05 Cough: Secondary | ICD-10-CM

## 2013-05-16 DIAGNOSIS — J329 Chronic sinusitis, unspecified: Secondary | ICD-10-CM

## 2013-05-16 MED ORDER — HYDROCODONE-HOMATROPINE 5-1.5 MG/5ML PO SYRP: 5.0000 mL | ORAL_SOLUTION | Freq: Three times a day (TID) | ORAL | Status: DC | PRN

## 2013-05-16 MED ORDER — AMOXICILLIN-POT CLAVULANATE 875-125 MG PO TABS: 1.0000 | ORAL_TABLET | Freq: Two times a day (BID) | ORAL | Status: DC

## 2013-05-16 NOTE — Progress Notes (Signed)
  Subjective:    Patient ID: Melissa May, female    DOB: 1980-12-01, 32 y.o.   MRN: [id number]  HPI 32 year old female presents for evaluation of sinus congestion, rhinorrhea, dry, hacking cough, and right sided sinus pain.  Was seen at [facility] UC on 9/14 and told she had a viral URI and given a taper of prednisone. She has been taking this plus OTC Dayquil/Nyquil which does not seem to be helping.  Admits symptoms have progressively worsened and now that she has severe right sided facial pain decided to come in for a recheck.  Does have a hx of sinus infection and seasonal allergies. She takes Zyrtec daily which has helped decrease the frequency of her infections - has not had one in over a year.  Has uses nasal sprays in the past that have not helped much.   Denies fever, chills, nausea, vomiting, SOB, wheezing, hemoptysis, otalgia, or sore throat.  Patient is otherwise doing well with no other concerns today.      Review of Systems  Constitutional: Negative for fever and chills.  HENT: Positive for congestion, rhinorrhea, postnasal drip and sinus pressure (right side). Negative for ear pain and sore throat.   Respiratory: Positive for cough. Negative for shortness of breath and wheezing.   Gastrointestinal: Negative for nausea, vomiting and abdominal pain.  Neurological: Negative for dizziness and headaches.       Objective:   Physical Exam  Constitutional: She is oriented to person, place, and time. She appears well-developed and well-nourished.  HENT:  Head: Normocephalic and atraumatic.  Right Ear: Hearing, tympanic membrane, external ear and ear canal normal.  Left Ear: Hearing, tympanic membrane, external ear and ear canal normal.  Nose: Right sinus exhibits maxillary sinus tenderness. Right sinus exhibits no frontal sinus tenderness. Left sinus exhibits no maxillary sinus tenderness and no frontal sinus tenderness.  Mouth/Throat: Uvula is midline, oropharynx is clear and moist  and mucous membranes are normal.  Eyes: Conjunctivae are normal.  Neck: Normal range of motion. Neck supple.  Cardiovascular: Normal rate, regular rhythm and normal heart sounds.   Pulmonary/Chest: Effort normal and breath sounds normal.  Lymphadenopathy:    She has no cervical adenopathy.  Neurological: She is alert and oriented to person, place, and time.  Psychiatric: She has a normal mood and affect. Her behavior is normal. Judgment and thought content normal.          Assessment & Plan:  Sinus infection - Plan: amoxicillin-clavulanate (AUGMENTIN) 875-125 MG per tablet  Cough - Plan: HYDROcodone-homatropine (HYCODAN) 5-1.5 MG/5ML syrup  Start Augmentin 875 mg bid x 10 days Finish prednisone taper Hycodan qhs prn cough Increase fluids and rest Follow up if symptoms worsen or fail to improve.

## 2013-05-16 NOTE — Telephone Encounter (Signed)
Left message to call office again. Results are not in office. To verify from patient when returns call, where were the labs done?

## 2013-05-17 ENCOUNTER — Telehealth (HOSPITAL_COMMUNITY): Payer: Self-pay | Admitting: Family Medicine

## 2013-05-17 LAB — CULTURE, GROUP A STREP

## 2013-05-17 NOTE — ED Notes (Signed)
Called regarding lab results. Left a message Asked to call back.  Continue ABX.  F/u PRN  Rodolph Bong, MD 05/17/13 1315

## 2013-05-17 NOTE — Telephone Encounter (Signed)
Patient has the results of her husband test wants to bring them by.

## 2013-05-18 ENCOUNTER — Telehealth (HOSPITAL_COMMUNITY): Payer: Self-pay | Admitting: *Deleted

## 2013-05-18 NOTE — ED Notes (Addendum)
I called and left a message to call.  Call 2. Melissa May 05/18/2013 Pt. called back.  Pt. verified x 2 and given results.  Pt. instructed to complete all of the Augmentin. States she got from the [facility]. She went there on 9/16 because her face was swollen.  She said Dr. Denyse Amass had left the results on her VM. I told her I did not know, because his note just said he left a message.She said she was doing much better.

## 2013-05-23 NOTE — Telephone Encounter (Signed)
Call to patient to check on results.  She states she dropped them off on Thursday and is waiting to hear from Dr Farrel Gobble.

## 2013-05-24 NOTE — Telephone Encounter (Signed)
Pt returning call to nurse

## 2013-05-26 NOTE — Telephone Encounter (Signed)
Phone message to Dr Farrel Gobble (who is out of office) on Wed 05-24-13.  She will contact patient regarding results.

## 2013-05-27 ENCOUNTER — Telehealth: Payer: Self-pay | Admitting: Gynecology

## 2013-05-27 NOTE — Telephone Encounter (Signed)
Spoke with patient regarding her husband's recent seman analysis, he has been started on clomid for low count and testosterone.  They have female and female factor infertility. Review of the 2 semans shows an increase in total volume from 1.5cc to 2.4cc and an increase in # to 145m but concentration is unchanged.  Pt informed that he had 4% normals with many head and mid defects, his testosterone remains low but prior value is not available so unknown if this is an improvement. I had suggested based on the low % of normal sperm that she might benefit from being treated by REI and might benefit from either IUI or maybe IVF/ICSI but suggest that she discuss this with REI.  I did not feel that she would be best served from my stimulating her with clomid and timed coitus. Questions addressed. She has the name of an REI she was referred to by friend. Best wished given

## 2013-06-28 ENCOUNTER — Telehealth: Payer: Self-pay | Admitting: Gynecology

## 2013-06-28 NOTE — Telephone Encounter (Signed)
Patient is seeing Dr. Laural Benes at [facility] in [place] for Infertility. Patient called to schedule an HSG that Dr. Laural Benes ordered for her. I advised that we send our patients to [facility] hospital for this type of scan. Patient is agreeable. Monday, Wednesday or Friday works best for patient. Patient said order was faxed to Dr. Farrel Gobble from Dr. Laural Benes. Patient is going to call with her cycle so we can schedule her for a HSG during days 7-10.

## 2013-07-06 ENCOUNTER — Other Ambulatory Visit: Payer: Self-pay | Admitting: Gynecology

## 2013-07-06 ENCOUNTER — Ambulatory Visit (INDEPENDENT_AMBULATORY_CARE_PROVIDER_SITE_OTHER): Payer: 59 | Admitting: Family Medicine

## 2013-07-06 VITALS — BP 110/72 | HR 95 | Temp 97.9°F | Resp 18 | Ht 63.0 in | Wt 238.8 lb

## 2013-07-06 DIAGNOSIS — R05 Cough: Secondary | ICD-10-CM

## 2013-07-06 DIAGNOSIS — J019 Acute sinusitis, unspecified: Secondary | ICD-10-CM

## 2013-07-06 DIAGNOSIS — N970 Female infertility associated with anovulation: Secondary | ICD-10-CM

## 2013-07-06 DIAGNOSIS — R059 Cough, unspecified: Secondary | ICD-10-CM

## 2013-07-06 DIAGNOSIS — N920 Excessive and frequent menstruation with regular cycle: Secondary | ICD-10-CM

## 2013-07-06 MED ORDER — AZITHROMYCIN 250 MG PO TABS: ORAL_TABLET | ORAL | Status: DC

## 2013-07-06 MED ORDER — IPRATROPIUM BROMIDE 0.03 % NA SOLN: 2.0000 | Freq: Two times a day (BID) | NASAL | Status: DC

## 2013-07-06 MED ORDER — ALBUTEROL SULFATE HFA 108 (90 BASE) MCG/ACT IN AERS: 2.0000 | INHALATION_SPRAY | Freq: Four times a day (QID) | RESPIRATORY_TRACT | Status: DC | PRN

## 2013-07-06 MED ORDER — DOXYCYCLINE HYCLATE 100 MG PO CAPS: 100.0000 mg | ORAL_CAPSULE | Freq: Two times a day (BID) | ORAL | Status: DC

## 2013-07-06 NOTE — Progress Notes (Signed)
[facility]:  Office Visit  Chief Complaint:  Chief Complaint  Patient presents with  . Cough    since Monday  . ears blocked  . Nasal Congestion  . Fatigue    HPI: Melissa May is a 32 y.o. female who is here for cold symptoms. For the last 4 days she has had a productive green cough, headache, fatigue, chest tightness, sinus drainage, and stuffy nose. She has a h/o asthma and has had to use albuterol but not really for wheezing. SHe doe snot feel like it is in her chest, she feels it is more in face, no SOB SH ehas allergies She has tried Day Quil and Nyquil OTC for the last couple days. She has not notice any fever. She has chills at times.  She is trying to get pregnant  Past Medical History  Diagnosis Date  . Viral infection   . Pharyngitis   . Asthma   . Multiple allergies   . Allergy   . Asthma   . Abnormal uterine bleeding     2 weeks of bleeding in March and April of 2014  . Dysmenorrhea   . GERD (gastroesophageal reflux disease)   . Depression    Past Surgical History  Procedure Laterality Date  . Upper gastrointestinal endoscopy  2011   History   Social History  . Marital Status: Married    Spouse Name: N/A    Number of Children: 0  . Years of Education: N/A   Occupational History  . Therapist    Social History Main Topics  . Smoking status: Never Smoker   . Smokeless tobacco: Never Used  . Alcohol Use: 1.2 oz/week    1 Glasses of wine, 1 Cans of beer per week     Comment: occasionally  . Drug Use: No  . Sexual Activity: Yes    Birth Control/ Protection: Pill   Other Topics Concern  . None   Social History Narrative   ** Merged History Encounter **       Family History  Problem Relation Age of Onset  . Cancer Father   . Hyperlipidemia    . Hypertension    . Asthma Paternal Grandmother   . Diabetes Paternal Grandmother   . Allergies Brother   . Kidney cancer Father   . Hypertension Father   . Arthritis Mother    . Hypertension Maternal Grandfather    Allergies  Allergen Reactions  . Lexapro Oral Solution [Escitalopram Oxalate]     GI upset and irritability   Prior to Admission medications   Medication Sig Start Date End Date Taking? Authorizing Provider  cetirizine (ZYRTEC) 10 MG tablet Take 10 mg by mouth daily.   Yes Historical Provider, MD  famotidine (PEPCID) 20 MG tablet Take 20 mg by mouth daily.   Yes Historical Provider, MD  FLUoxetine (PROZAC) 40 MG capsule Take 40 mg by mouth daily.   Yes Historical Provider, MD  amoxicillin-clavulanate (AUGMENTIN) 875-125 MG per tablet Take 1 tablet by mouth 2 (two) times daily. 05/16/13   Heather Jaquita Rector, PA-C  doxycycline (VIBRAMYCIN) 100 MG capsule Take 1 capsule (100 mg total) by mouth 2 (two) times daily. begin night before HSG 07/06/13   Bennye Alm, MD  HYDROcodone-homatropine [facility]) 5-1.5 MG/5ML syrup Take 5 mLs by mouth every 8 (eight) hours as needed for cough. 05/16/13   Heather Jaquita Rector, PA-C  norethindrone-ethinyl estradiol (OVCON-35,BALZIVA,BRIELLYN) 0.4-35 MG-MCG tablet Take 1 tablet by mouth daily.  04/14/13   Bennye Alm, MD  predniSONE (DELTASONE) 20 MG tablet Take 1 tablet (20 mg total) by mouth daily. 05/14/13   Rodolph Bong, MD     ROS: The patient denies fevers, chills, night sweats, unintentional weight loss, chest pain, palpitations, wheezing, dyspnea on exertion, nausea, vomiting, abdominal pain, dysuria, hematuria, melena, numbness, weakness, or tingling.   All other systems have been reviewed and were otherwise negative with the exception of those mentioned in the HPI and as above.    PHYSICAL EXAM: Filed Vitals:   07/06/13 1746  BP: 110/72  Pulse: 95  Temp: 97.9 F (36.6 C)  Resp: 18   Filed Vitals:   07/06/13 1746  Height: 5\' 3"  (1.6 m)  Weight: 238 lb 12.8 oz (108.319 kg)   Body mass index is 42.31 kg/(m^2).  General: Alert, no acute distress HEENT:  Normocephalic, atraumatic, oropharynx patent. EOMI,  PERRLA + sinus tenderness, boggy nares, TM nl, no exudates Cardiovascular:  Regular rate and rhythm, no rubs murmurs or gallops.  No Carotid bruits, radial pulse intact. No pedal edema.  Respiratory: Clear to auscultation bilaterally.  No wheezes, rales, or rhonchi.  No cyanosis, no use of accessory musculature GI: No organomegaly, abdomen is soft and non-tender, positive bowel sounds.  No masses. Skin: No rashes. Neurologic: Facial musculature symmetric. Psychiatric: Patient is appropriate throughout our interaction. Lymphatic: No cervical lymphadenopathy Musculoskeletal: Gait intact.   LABS: Results for orders placed during the hospital encounter of 05/14/13  CULTURE, GROUP A STREP      Result Value Range   Specimen Description THROAT     Special Requests ADDED 1932     Culture       Value: STREPTOCOCCUS,BETA HEMOLYIC NOT GROUP A     Performed at [facility]   Report Status 05/17/2013 FINAL    POCT RAPID STREP A ([facility] ONLY)      Result Value Range   Streptococcus, Group A Screen (Direct) NEGATIVE  NEGATIVE     EKG/XRAY:   Primary read interpreted by Dr. Conley Rolls at [facility].   ASSESSMENT/PLAN: Encounter Diagnoses  Name Primary?  . Acute sinusitis Yes  . Cough    Rx  Azithromycin ( had diarrhea with augmetin) She is tryign to get pregnant Rx Atrovent Rx Albuterol Otc cough medicine F/u prn  Gross sideeffects, risk and benefits, and alternatives of medications d/w patient. Patient is aware that all medications have potential sideeffects and we are unable to predict every sideeffect or drug-drug interaction that may occur.  LE, THAO PHUONG, DO 07/06/2013 7:00 PM

## 2013-07-06 NOTE — Patient Instructions (Signed)

## 2013-07-07 ENCOUNTER — Telehealth: Payer: Self-pay | Admitting: *Deleted

## 2013-07-07 NOTE — Telephone Encounter (Signed)
S/w patient she said she went to Urgent Care and already picked up Doxycycline along with her other medications that were sent in to her pharmacy and is aware to take the Doxycycline the night before her test. She said that Monday would make the 8th day of her cycle. She asked if we would need to call over to [facility] and set up appointment or she could just walk in. S/w Dr. Farrel Gobble she says that patient can wait until Monday for test and that all she would need to do is just call over and let them know it was time for her HSG, patient notified and aware.

## 2013-07-07 NOTE — Telephone Encounter (Signed)
Message copied by Lorraine Lax on Fri Jul 07, 2013  9:36 AM ------      Message from: Douglass Rivers      Created: Thu Jul 06, 2013  9:32 AM       i dropped the order for her HSG and added doxycycline which i always give to start the night before the test, i could not tell if they ordered it, can you call her and let her know that [facility] and about the doxy  ------

## 2013-07-07 NOTE — Telephone Encounter (Signed)
Can we just make sure that she is scheduled for Monday?

## 2013-07-07 NOTE — Telephone Encounter (Signed)
[facility]. She is not on scheduled for HSG.  I do not have a recent LMP. Message left to return call to [employer] at [phone].

## 2013-07-10 NOTE — Telephone Encounter (Signed)
Message left to return call to Simone Rodenbeck at [phone].    

## 2013-07-10 NOTE — Telephone Encounter (Signed)
Spoke with patient. States LMP 10/19-10/22. Should start next cycle on  11/17 per patient. [facility] at [phone]. They need patient to call on first day of LMP. Will need to have exam days 7-10. Left message to advise of message for patient, she will need to call [facility] to schedule on first day of cycle.

## 2013-07-11 ENCOUNTER — Other Ambulatory Visit: Payer: Self-pay | Admitting: Family Medicine

## 2013-07-11 ENCOUNTER — Telehealth: Payer: Self-pay

## 2013-07-11 MED ORDER — AMOXICILLIN 500 MG PO CAPS: 500.0000 mg | ORAL_CAPSULE | Freq: Two times a day (BID) | ORAL | Status: DC

## 2013-07-11 NOTE — Telephone Encounter (Signed)
LM she can pick up new rx, she has had diarrhea with Augmentin so willl do Amox 500 mg BID x 10 days.

## 2013-07-11 NOTE — Telephone Encounter (Signed)
PT STATES DR LE TOLD HER IF NO BETTER, SHE WOULD CALL HER IN A DIFFERENT ANTIBIOTIC. PLEASE CALL [phone]   [facility] IF TODAY OR AFTERWARD TO [facility] [address]

## 2013-07-24 ENCOUNTER — Ambulatory Visit (HOSPITAL_COMMUNITY)
Admission: RE | Admit: 2013-07-24 | Discharge: 2013-07-24 | Disposition: A | Discharge status: Home / Self Care | Payer: 59 | Source: Ambulatory Visit | Attending: Gynecology | Admitting: Gynecology

## 2013-07-24 DIAGNOSIS — N920 Excessive and frequent menstruation with regular cycle: Secondary | ICD-10-CM

## 2013-07-24 DIAGNOSIS — N979 Female infertility, unspecified: Secondary | ICD-10-CM | POA: Insufficient documentation

## 2013-07-24 DIAGNOSIS — N970 Female infertility associated with anovulation: Secondary | ICD-10-CM

## 2013-07-24 MED ORDER — IOHEXOL 300 MG/ML  SOLN
20.0000 mL | Freq: Once | INTRAMUSCULAR | Status: AC | PRN
  Administered 2013-07-24: 6 mL

## 2013-08-08 ENCOUNTER — Encounter (HOSPITAL_BASED_OUTPATIENT_CLINIC_OR_DEPARTMENT_OTHER): Payer: Self-pay | Admitting: *Deleted

## 2013-08-14 ENCOUNTER — Telehealth: Payer: Self-pay | Admitting: Gynecology

## 2013-08-14 NOTE — Telephone Encounter (Signed)
Spoke with patient. She has called and cancelled her eye surgery for tomorrow and OV with Dr. Farrel Gobble scheduled for 12/16.

## 2013-08-14 NOTE — Telephone Encounter (Signed)
Message left to return call to Melissa May at [phone].    

## 2013-08-14 NOTE — Telephone Encounter (Signed)
Patient says she had a positive pregnancy test over the weekend. Patient says she is supposed to have surgery tomorrow.

## 2013-08-15 ENCOUNTER — Ambulatory Visit (INDEPENDENT_AMBULATORY_CARE_PROVIDER_SITE_OTHER): Payer: 59 | Admitting: Gynecology

## 2013-08-15 ENCOUNTER — Ambulatory Visit (HOSPITAL_BASED_OUTPATIENT_CLINIC_OR_DEPARTMENT_OTHER): Admission: RE | Admit: 2013-08-15 | Payer: 59 | Source: Ambulatory Visit | Admitting: Ophthalmology

## 2013-08-15 VITALS — BP 102/70 | HR 66 | Resp 12 | Ht 62.0 in

## 2013-08-15 DIAGNOSIS — N912 Amenorrhea, unspecified: Secondary | ICD-10-CM

## 2013-08-15 HISTORY — DX: Arthralgia of temporomandibular joint, unspecified side: M26.629

## 2013-08-15 HISTORY — DX: Duane's syndrome, right eye: H50.811

## 2013-08-15 SURGERY — REPAIR STRABISMUS
Anesthesia: General | Site: Eye | Laterality: Right

## 2013-08-15 NOTE — Progress Notes (Signed)
Subjective:     Patient ID: Melissa May, female   DOB: 03/06/1981, 32 y.o.   MRN: [id number]  HPI Comments: Pt here with husband for +UPT at home.  Pt had been referred to [facility] for female and female factor infertility.  Her husband has been on clomid for low testosterone and low normal sperm count with multiple defects. Pt just had HSG which was normal.  Pt never started at [facility].  LMP 07/17/13.  Pt is taking otc prenatal vitamins-chewable.  Pt reports mastalgia and mild nausea.  Pt was on ocp until October and stopped in anticipation of fertility treatments.   They are both astatic with pregnancy.     Review of Systems  Genitourinary: Negative for vaginal bleeding, vaginal pain and pelvic pain.       Objective:   Physical Exam  Nursing note and vitals reviewed. Constitutional: She is oriented to person, place, and time. She appears well-developed and well-nourished.  Neurological: She is alert and oriented to person, place, and time.       Assessment:     +UPT with long standing infertility     Plan:     congratulations given. Questions addressed regarding medications, activities, diet.  We had tried to stop pt's prozac when beginning fertility treatments but were unsuccessful.  Pt is aware that it is cat C and if benefits outweigh risks, she should continue.  Pt would prefer to continue. She is about 4w today, we will get quant today and repeat in 48h.  Plan for viability u/s at 6w. Instructed to call for pain or bleeding 73m spent >50% face to face, counseling for early pregnancy

## 2013-08-15 NOTE — Patient Instructions (Signed)
Meds: tylenol, robitussin, benadryl, pepcid, axid, nexium, gass-x Avoid acid-tomatos, juices, spicy, fried, broccoli, cabbage, cold cuts. No high order fish-tuna, mahi, sword, grouper-mercury issue No bire, goat, feta or blue-pasturized cheeses 1/2 unisom with vit b6 for nausea, ginger [place] dry-  Please call for vaginal bleeding or spotting or pain

## 2013-08-16 LAB — ABO AND RH: Rh Type: NEGATIVE

## 2013-08-18 ENCOUNTER — Telehealth: Payer: Self-pay | Admitting: *Deleted

## 2013-08-21 NOTE — Telephone Encounter (Signed)
Pt returning call

## 2013-08-22 ENCOUNTER — Other Ambulatory Visit (INDEPENDENT_AMBULATORY_CARE_PROVIDER_SITE_OTHER): Payer: 59

## 2013-08-22 ENCOUNTER — Other Ambulatory Visit: Payer: Self-pay | Admitting: Gynecology

## 2013-08-22 ENCOUNTER — Telehealth: Payer: Self-pay | Admitting: Emergency Medicine

## 2013-08-22 DIAGNOSIS — N912 Amenorrhea, unspecified: Secondary | ICD-10-CM

## 2013-08-22 NOTE — Telephone Encounter (Signed)
Spoke with patient. She will come today and have hcg drawn. Lab appointment scheduled.

## 2013-08-22 NOTE — Telephone Encounter (Signed)
Dr. Farrel Gobble requested that I call patient to assist with scheduling her next lab draw hcg qualitative. Message left to return call to [place] at [phone]. Lab released so she can have lab drawn at her location of choice.

## 2013-08-22 NOTE — Addendum Note (Signed)
Addended by: Lorraine Lax on: 08/22/2013 11:54 AM   Modules accepted: Orders

## 2013-08-23 LAB — HCG, QUANTITATIVE, PREGNANCY: hCG, Beta Chain, Quant, S: 9612.2 m[IU]/mL

## 2013-08-28 ENCOUNTER — Ambulatory Visit (INDEPENDENT_AMBULATORY_CARE_PROVIDER_SITE_OTHER): Payer: 59 | Admitting: Obstetrics & Gynecology

## 2013-08-28 ENCOUNTER — Other Ambulatory Visit: Payer: Self-pay | Admitting: Obstetrics & Gynecology

## 2013-08-28 ENCOUNTER — Other Ambulatory Visit: Payer: Self-pay | Admitting: *Deleted

## 2013-08-28 ENCOUNTER — Inpatient Hospital Stay: Payer: Self-pay

## 2013-08-28 ENCOUNTER — Ambulatory Visit (HOSPITAL_COMMUNITY)
Admission: RE | Admit: 2013-08-28 | Discharge: 2013-08-28 | Disposition: A | Discharge status: Home / Self Care | Payer: 59 | Source: Ambulatory Visit | Attending: Obstetrics & Gynecology | Admitting: Obstetrics & Gynecology

## 2013-08-28 ENCOUNTER — Encounter: Payer: Self-pay | Admitting: Obstetrics & Gynecology

## 2013-08-28 ENCOUNTER — Inpatient Hospital Stay (HOSPITAL_COMMUNITY)
Admission: AD | Admit: 2013-08-28 | Discharge: 2013-08-28 | Disposition: A | Discharge status: Home / Self Care | Payer: 59 | Source: Ambulatory Visit | Attending: Gynecology | Admitting: Gynecology

## 2013-08-28 VITALS — BP 98/60 | HR 76 | Resp 16 | Wt 241.0 lb

## 2013-08-28 DIAGNOSIS — O200 Threatened abortion: Secondary | ICD-10-CM

## 2013-08-28 DIAGNOSIS — Z2989 Encounter for other specified prophylactic measures: Secondary | ICD-10-CM | POA: Insufficient documentation

## 2013-08-28 DIAGNOSIS — Z298 Encounter for other specified prophylactic measures: Secondary | ICD-10-CM | POA: Insufficient documentation

## 2013-08-28 DIAGNOSIS — O99891 Other specified diseases and conditions complicating pregnancy: Secondary | ICD-10-CM | POA: Insufficient documentation

## 2013-08-28 DIAGNOSIS — O209 Hemorrhage in early pregnancy, unspecified: Secondary | ICD-10-CM

## 2013-08-28 DIAGNOSIS — N920 Excessive and frequent menstruation with regular cycle: Secondary | ICD-10-CM

## 2013-08-28 MED ORDER — RHO D IMMUNE GLOBULIN 300 MCG IM INJ: 300.0000 ug | INJECTION | Freq: Once | INTRAMUSCULAR | Status: DC

## 2013-08-28 MED ORDER — RHO D IMMUNE GLOBULIN 1500 UNIT/2ML IJ SOLN
300.0000 ug | Freq: Once | INTRAMUSCULAR | Status: AC
  Administered 2013-08-28: 300 ug via INTRAMUSCULAR
  Filled 2013-08-28: qty 2

## 2013-08-28 NOTE — Telephone Encounter (Signed)
Advised pharmacy to dc that order to that location. Patient is at [facility] hospital at this time and order is in [facility] for hospital dispensing. Dr. Hyacinth Meeker aware.

## 2013-08-28 NOTE — Telephone Encounter (Signed)
Pharmacy calling saying something about a prescription that was just sent to them.  rho,D, immune globulin (HYPERHO S/D) 300 MCG INJ injection  Inject 300 mcg into the muscle once., Starting 08/28/2013, Normal, Last Dose: Not Recorded  Refills: 0 ordered Pharmacy:  [facility] - [place], [place] - [address]

## 2013-08-28 NOTE — Progress Notes (Signed)
Subjective:     Patient ID: Melissa May, female   DOB: 02-Sep-1980, 32 y.o.   MRN: [id number]  HPI 32 yo G1P0 MWF here with first trimester bleeding/spotting.  Pt with hx of infertility and seen at [facility].  Has done Clomid and was getting ready to start IUI's.  She conceived on her own between these treatments.  HCGs have been rising appropriately with HCG12/16 of 723 and then on 12/23 9612.  Pt comes in today with complaint of vaginal spotting, bright red, every time she has a bowel movement.  She is having some generalized lower pelvic cramping as well as a little bit of right sided pain.  This is mild.  Together, however, this has [name] very anxious.  Mild nausea and fatigue present as well.  Review of Systems  All other systems reviewed and are negative.       Objective:   Physical Exam  Constitutional: She appears well-developed and well-nourished.  Abdominal: Soft. Bowel sounds are normal. She exhibits no distension and no mass. There is no tenderness. There is no rebound and no guarding.  Genitourinary: There is no rash or tenderness on the right labia. There is no rash or tenderness on the left labia. Uterus is enlarged (8 weeks in size and full). Uterus is not deviated and not tender. Cervix exhibits friability (with very ectocervical transition zone.  Bleeding appears to be from ectocervic and not from os.). Cervix exhibits no discharge. Right adnexum displays no mass and no tenderness. Left adnexum displays no mass and no tenderness. No bleeding (but only light and streaky looking) around the vagina. No vaginal discharge found.  Musculoskeletal: Normal range of motion.  Lymphadenopathy:       Right: No inguinal adenopathy present.       Left: No inguinal adenopathy present.  Neurological: She is alert.  Skin: Skin is warm and dry.  Psychiatric: She has a normal mood and affect.       Assessment:     Threatened abortion MBT O neg (pt has blood donor card with her today)      Plan:     Plan viability scan.  Pt would like to do this at [facility] hospital today if possible. Rhogam work-up and administration at [facility].  Called there personally and orders given.

## 2013-08-29 LAB — RH IG WORKUP (INCLUDES ABO/RH)
ABO/RH(D): O NEG
Gestational Age(Wks): 6

## 2013-08-29 NOTE — Progress Notes (Signed)
No PUS scheduled or order to be canceled.

## 2013-09-06 ENCOUNTER — Telehealth: Payer: Self-pay | Admitting: Gynecology

## 2013-09-06 ENCOUNTER — Encounter (HOSPITAL_COMMUNITY): Payer: Self-pay

## 2013-09-06 ENCOUNTER — Inpatient Hospital Stay (HOSPITAL_COMMUNITY)
Admission: AD | Admit: 2013-09-06 | Discharge: 2013-09-06 | Disposition: A | Discharge status: Home / Self Care | Payer: 59 | Source: Ambulatory Visit | Attending: Obstetrics & Gynecology | Admitting: Obstetrics & Gynecology

## 2013-09-06 DIAGNOSIS — O9989 Other specified diseases and conditions complicating pregnancy, childbirth and the puerperium: Principal | ICD-10-CM

## 2013-09-06 DIAGNOSIS — O210 Mild hyperemesis gravidarum: Secondary | ICD-10-CM | POA: Insufficient documentation

## 2013-09-06 DIAGNOSIS — A088 Other specified intestinal infections: Secondary | ICD-10-CM | POA: Insufficient documentation

## 2013-09-06 DIAGNOSIS — O99891 Other specified diseases and conditions complicating pregnancy: Secondary | ICD-10-CM | POA: Insufficient documentation

## 2013-09-06 DIAGNOSIS — R197 Diarrhea, unspecified: Secondary | ICD-10-CM | POA: Insufficient documentation

## 2013-09-06 DIAGNOSIS — A084 Viral intestinal infection, unspecified: Secondary | ICD-10-CM

## 2013-09-06 DIAGNOSIS — E860 Dehydration: Secondary | ICD-10-CM | POA: Insufficient documentation

## 2013-09-06 LAB — BASIC METABOLIC PANEL
BUN: 5 mg/dL — ABNORMAL LOW (ref 6–23)
CALCIUM: 8.9 mg/dL (ref 8.4–10.5)
CO2: 24 mEq/L (ref 19–32)
CREATININE: 0.52 mg/dL (ref 0.50–1.10)
Chloride: 101 mEq/L (ref 96–112)
GFR calc Af Amer: >90 mL/min (ref >90)
GLUCOSE: 95 mg/dL (ref 70–99)
Potassium: 3.9 mEq/L (ref 3.7–5.3)
Sodium: 138 mEq/L (ref 137–147)

## 2013-09-06 LAB — URINALYSIS, ROUTINE W REFLEX MICROSCOPIC
BILIRUBIN URINE: NEGATIVE
Glucose, UA: NEGATIVE mg/dL
Hgb urine dipstick: NEGATIVE
Ketones, ur: 15 mg/dL — AB
Leukocytes, UA: NEGATIVE
NITRITE: NEGATIVE
PROTEIN: NEGATIVE mg/dL
Specific Gravity, Urine: 1.025 (ref 1.005–1.030)
UROBILINOGEN UA: 0.2 mg/dL (ref 0.0–1.0)
pH: 5.5 (ref 5.0–8.0)

## 2013-09-06 LAB — CBC
HCT: 37.3 % (ref 36.0–46.0)
HEMOGLOBIN: 12.5 g/dL (ref 12.0–15.0)
MCH: 30.7 pg (ref 26.0–34.0)
MCHC: 33.5 g/dL (ref 30.0–36.0)
MCV: 91.6 fL (ref 78.0–100.0)
PLATELETS: 327 10*3/uL (ref 150–400)
RBC: 4.07 MIL/uL (ref 3.87–5.11)
RDW: 12.9 % (ref 11.5–15.5)
WBC: 11.9 10*3/uL — ABNORMAL HIGH (ref 4.0–10.5)

## 2013-09-06 MED ORDER — PROMETHAZINE HCL 12.5 MG PO TABS
12.5000 mg | ORAL_TABLET | Freq: Four times a day (QID) | ORAL | Status: DC | PRN
Start: 2013-09-06 — End: 2014-04-15

## 2013-09-06 MED ORDER — PROMETHAZINE HCL 25 MG/ML IJ SOLN
25.0000 mg | Freq: Once | INTRAVENOUS | Status: AC
  Administered 2013-09-06: 25 mg via INTRAVENOUS
  Filled 2013-09-06: qty 1

## 2013-09-06 NOTE — Telephone Encounter (Signed)
If patient is not able to receive care through her OB office, I recommend that she present to the [facility] Admission for evaluation and treatment.  Patient may need IVF hydration and IV antimetics.

## 2013-09-06 NOTE — Telephone Encounter (Signed)
Patient notified of Dr Elza Rafter recommendation and is agreeable.

## 2013-09-06 NOTE — [facility]U Provider Note (Signed)
Chief Complaint: Nausea, Emesis and Diarrhea   First Provider Initiated Contact with Patient 09/06/13 1344     SUBJECTIVE HPI: Melissa May is a 33 y.o. G1P0 at [redacted]w[redacted]d by LMP who presents with onset N/V/D yesterday am and has retained nothing but 1 bite applesause over 24 hrs. No prior N/V. Still nauseated. Diarrhea not bloody. Had 20 episodes loose stools yesterday and 6 so far today. No sick contacts. Has NOB scheduled at [facility] later this month after being followed by Dr. Charlies Constable for infertility. Has been on PNV x2 years.  Past Medical History  Diagnosis Date  . GERD (gastroesophageal reflux disease)   . Depression   . Seasonal allergies   . Asthma     prn inhaler  . TMJ syndrome   . 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 Upper gastrointestinal endoscopy  2009  . Wisdom tooth extraction    . Strabismus surgery      as an infant   History   Social History  . Marital Status: Married    Spouse Name: N/A    Number of Children: 0  . Years of Education: N/A   Occupational History  . Therapist    Social History Main Topics  . Smoking status: Never Smoker   . Smokeless tobacco: Never Used  . Alcohol Use: No     Comment: occasionally  . Drug Use: No  . Sexual Activity: Yes    Partners: Male   Other Topics Concern  . Not on file   Social History Narrative   ** Merged History Encounter **       No current facility-administered medications on file prior to encounter.   Current Outpatient Prescriptions on File Prior to Encounter  Medication Sig Dispense Refill  . albuterol (PROVENTIL HFA;VENTOLIN HFA) 108 (90 BASE) MCG/ACT inhaler Inhale 2 puffs into the lungs every 6 (six) hours as needed for wheezing or shortness of breath. Please give her an  MDI  1 Inhaler  0  . cetirizine (ZYRTEC) 10 MG tablet Take 10 mg by mouth daily.      . famotidine (PEPCID) 20 MG tablet Take 20 mg by mouth daily.      Marland Kitchen FLUoxetine (PROZAC) 40 MG capsule Take 40 mg by mouth daily.      . Prenatal Vit-Fe Sulfate-FA (PRENATAL VITAMIN PO) Take by mouth daily.      . rho,D, immune globulin (HYPERHO S/D) 300 MCG INJ injection Inject 300 mcg into the muscle once.  1 each  0   Allergies  Allergen Reactions  . Lexapro [Escitalopram Oxalate] Nausea And Vomiting    LOSS OF APPETITE  . Adhesive [Tape] Rash    ROS: Pertinent items in HPI  OBJECTIVE Blood pressure 127/73, pulse 101, temperature 98.8 F (37.1 C), temperature source Oral, resp. rate 20, height 5' 2.5" (1.588 m), weight 238 lb 3.2 oz (108.047 kg), last menstrual period 07/17/2013, SpO2 98.00%. GENERAL: Well-developed, well-nourished female in no acute distress.  HEENT: Normocephalic HEART: normal rate RESP: normal effort ABDOMEN: Soft, non-tender EXTREMITIES: Nontender, no edema NEURO: Alert and oriented   LAB RESULTS Results for  orders placed during the hospital encounter of 09/06/13 (from the past 24 hour(s))  URINALYSIS, ROUTINE W REFLEX MICROSCOPIC     Status: Abnormal   Collection Time    09/06/13  1:10 PM      Result Value Range   Color, Urine YELLOW  YELLOW   APPearance CLEAR  CLEAR   Specific Gravity, Urine 1.025  1.005 - 1.030   pH 5.5  5.0 - 8.0   Glucose, UA NEGATIVE  NEGATIVE mg/dL   Hgb urine dipstick NEGATIVE  NEGATIVE   Bilirubin Urine NEGATIVE  NEGATIVE   Ketones, ur 15 (*) NEGATIVE mg/dL   Protein, ur NEGATIVE  NEGATIVE mg/dL   Urobilinogen, UA 0.2  0.0 - 1.0 mg/dL   Nitrite NEGATIVE  NEGATIVE   Leukocytes, UA NEGATIVE  NEGATIVE    IMAGING US Ob Comp Less 14 Wks  08/28/2013   CLINICAL DATA:  Vaginal bleeding. Gestational age by LMP of six weeks 0 days.  EXAM: OBSTETRIC <14 WK [place] AND TRANSVAGINAL OB US  TECHNIQUE: Both transabdominal and transvaginal ultrasound  examinations were performed for complete evaluation of the gestation as well as the maternal uterus, adnexal regions, and pelvic cul-de-sac. Transvaginal technique was performed to assess early pregnancy.  COMPARISON:  None.  FINDINGS: Intrauterine gestational sac: Visualized/normal in shape.  Yolk sac:  Visualized  Embryo:  Visualized  Cardiac Activity: Visualized  Heart Rate:  128 bpm  CRL:   6  mm   6 w 3 d                  [place] EDC: 04/20/2014  Maternal uterus/adnexae: No mass or free fluid identified. Small right ovarian corpus luteum cyst noted.  IMPRESSION: Single living IUP measuring 6 weeks 3 days, which is concordant with LMP.  No significant maternal uterine or adnexal abnormality identified.   Electronically Signed   By: Earle Gell M.D.   On: 08/28/2013 15:11   US Ob Transvaginal  08/28/2013   CLINICAL DATA:  Vaginal bleeding. Gestational age by LMP of six weeks 0 days.  EXAM: OBSTETRIC <14 WK [place] AND TRANSVAGINAL OB US  TECHNIQUE: Both transabdominal and transvaginal ultrasound examinations were performed for complete evaluation of the gestation as well as the maternal uterus, adnexal regions, and pelvic cul-de-sac. Transvaginal technique was performed to assess early pregnancy.  COMPARISON:  None.  FINDINGS: Intrauterine gestational sac: Visualized/normal in shape.  Yolk sac:  Visualized  Embryo:  Visualized  Cardiac Activity: Visualized  Heart Rate:  128 bpm  CRL:   6  mm   6 w 3 d                  [place] EDC: 04/20/2014  Maternal uterus/adnexae: No mass or free fluid identified. Small right ovarian corpus luteum cyst noted.  IMPRESSION: Single living IUP measuring 6 weeks 3 days, which is concordant with LMP.  No significant maternal uterine or adnexal abnormality identified.   Electronically Signed   By: Earle Gell M.D.   On: 08/28/2013 15:11    MAU COURSE  ASSESSMENT 1. Viral gastroenteritis    G1 @[redacted]w[redacted]d  probable VGE with mild dehydration  PLAN Discharge home    Medication List    ASK  your doctor about these medications       albuterol 108 (90 BASE) MCG/ACT inhaler  Commonly known as:  PROVENTIL HFA;VENTOLIN HFA  Inhale 2 puffs into the lungs every 6 (six) hours as needed for wheezing or shortness of breath. Please give her  an MDI     cetirizine 10 MG tablet  Commonly known as:  ZYRTEC  Take 10 mg by mouth daily.     famotidine 20 MG tablet  Commonly known as:  PEPCID  Take 20 mg by mouth daily.     FLUoxetine 40 MG capsule  Commonly known as:  PROZAC  Take 40 mg by mouth daily.     PRENATAL VITAMIN PO  Take by mouth daily.     rho(D) immune globulin 300 MCG Inj injection  Commonly known as:  HYPERHO S/D  Inject 300 mcg into the muscle once.       Follow-up Information   Follow up with [facility]. On 09/18/2013.   Contact information:   [place] [phone]      Lorene Dy, CNM 09/06/2013  1:45 PM

## 2013-09-06 NOTE — MAU Note (Signed)
Patient states she started having nausea, vomiting and diarrhea yesterday. Continues to have diarrhea today, no vomiting in 12 hours. Patient denies any S/S of flu. States she has a little abdominal cramping.

## 2013-09-06 NOTE — Telephone Encounter (Signed)
Patient had PUS at hospital last week and was confirmed viable IUP. Has scheduled appointment for OB care at [facility] next week but they will not give her any advise since she is not an established patient.   Patient reports onset of diarrhea yesterday afternoon that increased last night and included "violent vomiting" x4. Reports in excess of 12 diarrhea stools, 4 this am. No further vomiting this am. Has been able to keep liquids down this am but has not tried food, has not tried any medication.  Feels like mouth and lips are dry but thinks urine appears to be normal in color.  Called on-call [facility] nurse last night who advised against ER and to call OB who will not help her. Advised that avoiding ER is best as long as doesn't get worse or dehydrated. Will review with Dr Quincy Simmonds and call her back.

## 2013-09-06 NOTE — Discharge Instructions (Signed)
Morning Sickness Morning sickness is when you feel sick to your stomach (nauseous) during pregnancy. This nauseous feeling may or may not come with vomiting. It often occurs in the morning but can be a problem any time of day. Morning sickness is most common during the first trimester, but it may continue throughout pregnancy. While morning sickness is unpleasant, it is usually harmless unless you develop severe and continual vomiting (hyperemesis gravidarum). This condition requires more intense treatment.  CAUSES  The cause of morning sickness is not completely known but seems to be related to normal hormonal changes that occur in pregnancy. RISK FACTORS You are at greater risk if you:  Experienced nausea or vomiting before your pregnancy.  Had morning sickness during a previous pregnancy.  Are pregnant with more than one baby, such as twins. TREATMENT  Do not use any medicines (prescription, over-the-counter, or herbal) for morning sickness without first talking to your health care provider. Your health care provider may prescribe or recommend:  Vitamin B6 supplements.  Anti-nausea medicines.  The herbal medicine ginger. HOME CARE INSTRUCTIONS   Only take over-the-counter or prescription medicines as directed by your health care provider.  Taking multivitamins before getting pregnant can prevent or decrease the severity of morning sickness in most women.   Eat a piece of dry toast or unsalted crackers before getting out of bed in the morning.   Eat five or six small meals a day.   Eat dry and bland foods (rice, baked potato). Foods high in carbohydrates are often helpful.  Do not drink liquids with your meals. Drink liquids between meals.   Avoid greasy, fatty, and spicy foods.   Get someone to cook for you if the smell of any food causes nausea and vomiting.   If you feel nauseous after taking prenatal vitamins, take the vitamins at night or with a snack.  Snack  on protein foods (nuts, yogurt, cheese) between meals if you are hungry.   Eat unsweetened gelatins for desserts.   Wearing an acupressure wristband (worn for sea sickness) may be helpful.   Acupuncture may be helpful.   Do not smoke.   Get a humidifier to keep the air in your house free of odors.   Get plenty of fresh air. SEEK MEDICAL CARE IF:   Your home remedies are not working, and you need medicine.  You feel dizzy or lightheaded.  You are losing weight. SEEK IMMEDIATE MEDICAL CARE IF:   You have persistent and uncontrolled nausea and vomiting.  You pass out (faint). Document Released: 10/08/2006 Document Revised: 04/19/2013 Document Reviewed: 02/01/2013 [facility] Patient Information 2014 [place]. Viral Gastroenteritis Viral gastroenteritis is also known as stomach flu. This condition affects the stomach and intestinal tract. It can cause sudden diarrhea and vomiting. The illness typically lasts 3 to 8 days. Most people develop an immune response that eventually gets rid of the virus. While this natural response develops, the virus can make you quite ill. CAUSES  Many different viruses can cause gastroenteritis, such as rotavirus or noroviruses. You can catch one of these viruses by consuming contaminated food or water. You may also catch a virus by sharing utensils or other personal items with an infected person or by touching a contaminated surface. SYMPTOMS  The most common symptoms are diarrhea and vomiting. These problems can cause a severe loss of body fluids (dehydration) and a body salt (electrolyte) imbalance. Other symptoms may include:  Fever.  Headache.  Fatigue.  Abdominal pain. DIAGNOSIS  Your caregiver can usually diagnose viral gastroenteritis based on your symptoms and a physical exam. A stool sample may also be taken to test for the presence of viruses or other infections. TREATMENT  This illness typically goes away on its own.  Treatments are aimed at rehydration. The most serious cases of viral gastroenteritis involve vomiting so severely that you are not able to keep fluids down. In these cases, fluids must be given through an intravenous line (IV). HOME CARE INSTRUCTIONS   Drink enough fluids to keep your urine clear or pale yellow. Drink small amounts of fluids frequently and increase the amounts as tolerated.  Ask your caregiver for specific rehydration instructions.  Avoid:  Foods high in sugar.  Alcohol.  Carbonated drinks.  Tobacco.  Juice.  Caffeine drinks.  Extremely hot or cold fluids.  Fatty, greasy foods.  Too much intake of anything at one time.  Dairy products until 24 to 48 hours after diarrhea stops.  You may consume probiotics. Probiotics are active cultures of beneficial bacteria. They may lessen the amount and number of diarrheal stools in adults. Probiotics can be found in yogurt with active cultures and in supplements.  Wash your hands well to avoid spreading the virus.  Only take over-the-counter or prescription medicines for pain, discomfort, or fever as directed by your caregiver. Do not give aspirin to children. Antidiarrheal medicines are not recommended.  Ask your caregiver if you should continue to take your regular prescribed and over-the-counter medicines.  Keep all follow-up appointments as directed by your caregiver. SEEK IMMEDIATE MEDICAL CARE IF:   You are unable to keep fluids down.  You do not urinate at least once every 6 to 8 hours.  You develop shortness of breath.  You notice blood in your stool or vomit. This may look like coffee grounds.  You have abdominal pain that increases or is concentrated in one small area (localized).  You have persistent vomiting or diarrhea.  You have a fever.  The patient is a child younger than 3 months, and he or she has a fever.  The patient is a child older than 3 months, and he or she has a fever and  persistent symptoms.  The patient is a child older than 3 months, and he or she has a fever and symptoms suddenly get worse.  The patient is a baby, and he or she has no tears when crying. MAKE SURE YOU:   Understand these instructions.  Will watch your condition.  Will get help right away if you are not doing well or get worse. Document Released: 08/17/2005 Document Revised: 11/09/2011 Document Reviewed: 06/03/2011 [facility] Patient Information 2014 [place].

## 2013-09-06 NOTE — Progress Notes (Signed)
Patient and husband given infection prevention information and discussed.

## 2013-09-06 NOTE — Telephone Encounter (Signed)
Patient states she is about [redacted] weeks pregnant and has an appt scheduled with an OB but she thinkgs she may have a gi bug but they wont give her any advise since she hasnt been seen there yet she just wanted to get some advise.

## 2013-09-15 LAB — OB RESULTS CONSOLE RPR: RPR: NONREACTIVE

## 2013-09-15 LAB — OB RESULTS CONSOLE HEPATITIS B SURFACE ANTIGEN: Hepatitis B Surface Ag: NEGATIVE

## 2013-09-15 LAB — OB RESULTS CONSOLE ANTIBODY SCREEN: ANTIBODY SCREEN: POSITIVE

## 2013-09-15 LAB — OB RESULTS CONSOLE RUBELLA ANTIBODY, IGM: RUBELLA: NON-IMMUNE/NOT IMMUNE

## 2013-09-15 LAB — OB RESULTS CONSOLE HIV ANTIBODY (ROUTINE TESTING): HIV: NONREACTIVE

## 2013-09-15 LAB — OB RESULTS CONSOLE GBS: GBS: POSITIVE

## 2013-09-22 LAB — OB RESULTS CONSOLE GC/CHLAMYDIA
CHLAMYDIA, DNA PROBE: NEGATIVE
Gonorrhea: NEGATIVE

## 2013-10-07 ENCOUNTER — Inpatient Hospital Stay (HOSPITAL_COMMUNITY): Payer: 59

## 2013-10-07 ENCOUNTER — Inpatient Hospital Stay (HOSPITAL_COMMUNITY)
Admission: AD | Admit: 2013-10-07 | Discharge: 2013-10-07 | Disposition: A | Discharge status: Home / Self Care | Payer: 59 | Source: Ambulatory Visit | Attending: Obstetrics & Gynecology | Admitting: Obstetrics & Gynecology

## 2013-10-07 DIAGNOSIS — O209 Hemorrhage in early pregnancy, unspecified: Secondary | ICD-10-CM

## 2013-10-07 DIAGNOSIS — F3289 Other specified depressive episodes: Secondary | ICD-10-CM | POA: Insufficient documentation

## 2013-10-07 DIAGNOSIS — O200 Threatened abortion: Secondary | ICD-10-CM | POA: Insufficient documentation

## 2013-10-07 DIAGNOSIS — O99340 Other mental disorders complicating pregnancy, unspecified trimester: Secondary | ICD-10-CM | POA: Insufficient documentation

## 2013-10-07 DIAGNOSIS — F329 Major depressive disorder, single episode, unspecified: Secondary | ICD-10-CM | POA: Insufficient documentation

## 2013-10-07 DIAGNOSIS — R109 Unspecified abdominal pain: Secondary | ICD-10-CM | POA: Insufficient documentation

## 2013-10-07 DIAGNOSIS — K219 Gastro-esophageal reflux disease without esophagitis: Secondary | ICD-10-CM | POA: Insufficient documentation

## 2013-10-07 LAB — CBC
HCT: 37.5 % (ref 36.0–46.0)
Hemoglobin: 12.9 g/dL (ref 12.0–15.0)
MCH: 31.3 pg (ref 26.0–34.0)
MCHC: 34.4 g/dL (ref 30.0–36.0)
MCV: 91 fL (ref 78.0–100.0)
PLATELETS: 362 10*3/uL (ref 150–400)
RBC: 4.12 MIL/uL (ref 3.87–5.11)
RDW: 13.1 % (ref 11.5–15.5)
WBC: 14.6 10*3/uL — ABNORMAL HIGH (ref 4.0–10.5)

## 2013-10-07 LAB — URINALYSIS, ROUTINE W REFLEX MICROSCOPIC
BILIRUBIN URINE: NEGATIVE
GLUCOSE, UA: NEGATIVE mg/dL
Ketones, ur: NEGATIVE mg/dL
LEUKOCYTES UA: NEGATIVE
Nitrite: NEGATIVE
Protein, ur: NEGATIVE mg/dL
UROBILINOGEN UA: 0.2 mg/dL (ref 0.0–1.0)
pH: 6 (ref 5.0–8.0)

## 2013-10-07 LAB — URINE MICROSCOPIC-ADD ON

## 2013-10-07 NOTE — MAU Provider Note (Signed)
History     CSN: [id number]  Arrival date and time: 10/07/13 1329 Provider on unit: 1329 Orders placed: 1432 Provider at bedside: 1530     Chief Complaint  Patient presents with  . Vaginal Bleeding   HPI  [name] is a 33 yo G1P0 @ 11.[redacted]wks gestation by ultrasound presenting today with c/o vaginal bleeding like a period and mild cramping.  She had bleeding earlier in pregnancy and received an ultrasound on 08/28/13.  Her blood type is O Neg.  She received Rhogam on 08/28/13.  Her primary care provider at [facility] is Dr. Benjie Karvonen.  Past Medical History  Diagnosis Date  . GERD (gastroesophageal reflux disease)   . Depression   . Seasonal allergies   . Asthma     prn inhaler  . TMJ syndrome   . 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 Upper gastrointestinal endoscopy  2009  . Wisdom tooth extraction    . Strabismus surgery      as an infant    Family History  Problem Relation Age of Onset  . Cancer Father   . Hypertension Maternal Grandfather   . Arthritis Mother   . Asthma Paternal Grandmother   . Diabetes Paternal Grandmother   . Allergies Brother   . Kidney cancer Father   . Hypertension Father     History  Substance Use Topics  . Smoking status: Never Smoker   . Smokeless tobacco: Never Used  . Alcohol Use: No     Comment: occasionally    Allergies:  Allergies  Allergen Reactions  . Lexapro [Escitalopram Oxalate] Nausea And Vomiting    LOSS OF APPETITE  . Adhesive [Tape] Rash    Prescriptions prior to admission  Medication Sig Dispense Refill  . albuterol (PROVENTIL HFA;VENTOLIN HFA) 108 (90 BASE) MCG/ACT inhaler Inhale 2 puffs into the lungs every 6 (six) hours as needed for wheezing or shortness of breath.      . cetirizine (ZYRTEC) 10 MG tablet Take 10 mg by mouth at bedtime.       . famotidine (PEPCID) 10 MG tablet Take 10 mg by mouth at bedtime.      Marland Kitchen FLUoxetine (PROZAC) 40 MG capsule  Take 40 mg by mouth daily.      . Prenatal Vit-Fe Fumarate-FA (PRENATAL MULTIVITAMIN) TABS tablet Take 1 tablet by mouth at bedtime.       . promethazine (PHENERGAN) 12.5 MG tablet Take 1 tablet (12.5 mg total) by mouth every 6 (six) hours as needed for nausea or vomiting.  30 tablet  0    Review of Systems  Constitutional: Negative.   HENT: Negative.   Eyes: Negative.   Respiratory: Negative.   Cardiovascular: Negative.   Gastrointestinal: Negative.   Genitourinary:       Vaginal bleeding like a period, but lessened since arrival  Musculoskeletal: Negative.   Skin: Negative.   Neurological: Negative.   Endo/Heme/Allergies: Negative.   Psychiatric/Behavioral: Negative.    Results for orders placed during the hospital encounter of 10/07/13 (from the past 24 hour(s))  CBC     Status: Abnormal   Collection Time    10/07/13  2:49 PM      Result Value Range   WBC 14.6 (*) 4.0 - 10.5 K/uL   RBC 4.12  3.87 - 5.11 MIL/uL   Hemoglobin 12.9  12.0 - 15.0 g/dL   HCT 37.5  36.0 - 46.0 %  MCV 91.0  78.0 - 100.0 fL   MCH 31.3  26.0 - 34.0 pg   MCHC 34.4  30.0 - 36.0 g/dL   RDW 13.1  11.5 - 15.5 %   Platelets 362  150 - 400 K/uL  URINALYSIS, ROUTINE W REFLEX MICROSCOPIC     Status: Abnormal   Collection Time    10/07/13  2:51 PM      Result Value Range   Color, Urine YELLOW  YELLOW   APPearance CLEAR  CLEAR   Specific Gravity, Urine >1.030 (*) 1.005 - 1.030   pH 6.0  5.0 - 8.0   Glucose, UA NEGATIVE  NEGATIVE mg/dL   Hgb urine dipstick MODERATE (*) NEGATIVE   Bilirubin Urine NEGATIVE  NEGATIVE   Ketones, ur NEGATIVE  NEGATIVE mg/dL   Protein, ur NEGATIVE  NEGATIVE mg/dL   Urobilinogen, UA 0.2  0.0 - 1.0 mg/dL   Nitrite NEGATIVE  NEGATIVE   Leukocytes, UA NEGATIVE  NEGATIVE  URINE MICROSCOPIC-ADD ON     Status: Abnormal   Collection Time    10/07/13  2:51 PM      Result Value Range   Squamous Epithelial / LPF FEW (*) RARE   WBC, UA 0-2  <3 WBC/hpf   RBC / HPF 0-2  <3 RBC/hpf    Bacteria, UA FEW (*) RARE   US Ob Comp Less 14 Wks  10/07/2013  CLINICAL DATA:  Bleeding. Eleven weeks 5 day pregnancy by last menstrual period.  EXAM: OBSTETRIC <14 WK ULTRASOUND  TECHNIQUE: Transabdominal ultrasound was performed for evaluation of the gestation as well as the maternal uterus and adnexal regions.  COMPARISON:  US OB TRANSVAGINAL dated 08/28/2013  FINDINGS: Intrauterine gestational sac: Visualized/normal in shape.  Yolk sac:  Not visualized  Embryo:  Visualized  Cardiac Activity: Visualized  Heart Rate: 167 beats per min bpm  MSD:   mm    w     d  CRL:   59.4  mm   12 w for d  [place] EDC: 04/17/2014  Maternal uterus/adnexae: No subchorionic hemorrhage. Right ovary unremarkable. Left ovary not visualized. No free fluid.  IMPRESSION: Approximately 12 week intrauterine pregnancy. Fetal heart rate 167 beats per min. No acute maternal findings.  Electronically Signed   By: Rolm Baptise M.D.   On: 10/07/2013 16:34   Physical Exam   Blood pressure 117/58, pulse 90, temperature 98.1 F (36.7 C), temperature source Oral, resp. rate 18, height 5' 2.5" (1.588 m), weight 108.41 kg (239 lb), last menstrual period 07/17/2013.  Physical Exam  Constitutional: She is oriented to person, place, and time. She appears well-developed and well-nourished.  HENT:  Head: Normocephalic and atraumatic.  Eyes: Pupils are equal, round, and reactive to light.  Neck: Normal range of motion. Neck supple.  Cardiovascular: Normal rate, regular rhythm and normal heart sounds.   Respiratory: Effort normal and breath sounds normal.  GI: Soft. Bowel sounds are normal.  Genitourinary: Vagina normal and uterus normal.  Enlarged uterus, S=D  Musculoskeletal: Normal range of motion.  Neurological: She is alert and oriented to person, place, and time.  Skin: Skin is warm and dry.  Psychiatric: She has a normal mood and affect. Her behavior is normal. Judgment and thought content normal.  SSE: small amount of blood  tinged mucous removed with 2 large cotton-tipped swabs, no active bleeding seen from cervical os VE: closed / thick / firm / posterior   MAU Course  Procedures CCUA CBC OB U/S <14 wks  Assessment and Plan  33 yo SIUP @ 11.[redacted] wks gestation by ultrasound Bleeding in pregnancy, first-trimester Threatened Miscarriage  Pelvic Rest Good Hydration Increase Rest Keep scheduled appointment on Tuesday 10/10/2013 with Dr. Benjie Karvonen Bleeding precautions reviewed / signs and symptoms to call for reviewed  *Dr. Benjie Karvonen notified of plan - agrees  Graceann Congress, MSN, CNM 10/07/2013, 3:45 PM

## 2013-10-07 NOTE — Discharge Instructions (Signed)
Threatened Miscarriage  Bleeding during the first 20 weeks of pregnancy is common. This is sometimes called a threatened miscarriage. This is a pregnancy that is threatening to end before the twentieth week of pregnancy. Often this bleeding stops with bed rest or decreased activities as suggested by your caregiver and the pregnancy continues without any more problems. You may be asked to not have sexual intercourse, have orgasms or use tampons until further notice. Sometimes a threatened miscarriage can progress to a complete or incomplete miscarriage. This may or may not require further treatment. Some miscarriages occur before a woman misses a menstrual period and knows she is pregnant.  Miscarriages occur in 15 to 20% of all pregnancies and usually occur during the first 13 weeks of the pregnancy. The exact cause of a miscarriage is usually never known. A miscarriage is natures way of ending a pregnancy that is abnormal or would not make it to term. There are some things that may put you at risk to have a miscarriage, such as:  · Hormone problems.  · Infection of the uterus or cervix.  · Chronic illness, diabetes for example, especially if it is not controlled.  · Abnormal shaped uterus.  · Fibroids in the uterus.  · Incompetent cervix (the cervix is too weak to hold the baby).  · Smoking.  · Drinking too much alcohol. It's best not to drink any alcohol when you are pregnant.  · Taking illegal drugs.  TREATMENT   When a miscarriage becomes complete and all products of conception (all the tissue in the uterus) have been passed, often no treatment is needed. If you think you passed tissue, save it in a container and take it to your doctor for evaluation. If the miscarriage is incomplete (parts of the fetus or placenta remain in the uterus), further treatment may be needed. The most common reason for further treatment is continued bleeding (hemorrhage) because pregnancy tissue did not pass out of the uterus. This  often occurs if a miscarriage is incomplete. Tissue left behind may also become infected. Treatment usually is dilatation and curettage (the removal of the remaining products of pregnancy. This can be done by a simple sucking procedure (suction curettage) or a simple scraping of the inside of the uterus. This may be done in the hospital or in the caregiver's office. This is only done when your caregiver knows that there is no chance for the pregnancy to proceed to term. This is determined by physical examination, negative pregnancy test, falling pregnancy hormone count and/or, an ultrasound revealing a dead fetus.  Miscarriages are often a very emotional time for prospective mothers and fathers. This is not you or your partners fault. It did not occur because of an inadequacy in you or your partner. Nearly all miscarriages occur because the pregnancy has started off wrongly. At least half of these pregnancies have a chromosomal abnormality. It is almost always not inherited. Others may have developmental problems with the fetus or placenta. This does not always show up even when the products miscarried are studied under the microscope. The miscarriage is nearly always not your fault and it is not likely that you could have prevented it from happening. If you are having emotional and grieving problems, talk to your health care provider and even seek counseling, if necessary, before getting pregnant again. You can begin trying for another pregnancy as soon as your caregiver says it is OK.  HOME CARE INSTRUCTIONS   · Your caregiver may order   to only getting up to go to the bathroom. You may be allowed to continue light activity. You may need to make arrangements for the care of your other children and for any other responsibilities.  Keep track of the number of pads you use each day, how often you have to change pads and how  saturated (soaked) they are. Record this information.  DO NOT USE TAMPONS. Do not douche, have sexual intercourse or orgasms until approved by your caregiver.  You may receive a follow up appointment for re-evaluation of your pregnancy and a repeat blood test. Re-evaluation often occurs after 2 days and again in 4 to 6 weeks. It is very important that you follow-up in the recommended time period.  If you are Rh negative and the father is Rh positive or you do not know the fathers' blood type, you may receive a shot (Rh immune globulin) to help prevent abnormal antibodies that can develop and affect the baby in any future pregnancies. SEEK IMMEDIATE MEDICAL CARE IF:  You have severe cramps in your stomach, back, or abdomen.  You have a sudden onset of severe pain in the lower part of your abdomen.  You develop chills.  You run an unexplained temperature of 101 F (38.3 C) or higher.  You pass large clots or tissue. Save any tissue for your caregiver to inspect.  Your bleeding increases or you become light-headed, weak, or have fainting episodes.  You have a gush of fluid from your vagina.  You pass out. This could mean you have a tubal (ectopic) pregnancy. Document Released: 08/17/2005 Document Revised: 11/09/2011 Document Reviewed: 04/02/2008 [facility] Patient Information 2014 [place].  Pelvic Rest Pelvic rest is sometimes recommended for women when:   The placenta is partially or completely covering the opening of the cervix (placenta previa).  There is bleeding between the uterine wall and the amniotic sac in the first trimester (subchorionic hemorrhage).  The cervix begins to open without labor starting (incompetent cervix, cervical insufficiency).  The labor is too early (preterm labor). HOME CARE INSTRUCTIONS  Do not have sexual intercourse, stimulation, or an orgasm.  Do not use tampons, douche, or put anything in the vagina.  Do not lift anything over 10  pounds (4.5 kg).  Avoid strenuous activity or straining your pelvic muscles. SEEK MEDICAL CARE IF:  You have any vaginal bleeding during pregnancy. Treat this as a potential emergency.  You have cramping pain felt low in the stomach (stronger than menstrual cramps).  You notice vaginal discharge (watery, mucus, or bloody).  You have a low, dull backache.  There are regular contractions or uterine tightening. SEEK IMMEDIATE MEDICAL CARE IF: You have vaginal bleeding and have placenta previa.  Document Released: 12/12/2010 Document Revised: 11/09/2011 Document Reviewed: 12/12/2010 [facility] Patient Information 2014 [place].  Vaginal Bleeding During Pregnancy, First Trimester A small amount of bleeding (spotting) from the vagina is relatively common in early pregnancy. It usually stops on its own. Various things may cause bleeding or spotting in early pregnancy. Some bleeding may be related to the pregnancy, and some may not. In most cases, the bleeding is normal and is not a problem. However, bleeding can also be a sign of something serious. Be sure to tell your health care provider about any vaginal bleeding right away. Some possible causes of vaginal bleeding during the first trimester include:  Infection or inflammation of the cervix.  Growths (polyps) on the cervix.  Miscarriage or threatened miscarriage.  Pregnancy tissue has  developed outside of the uterus and in a fallopian tube (tubal pregnancy).  Tiny cysts have developed in the uterus instead of pregnancy tissue (molar pregnancy). HOME CARE INSTRUCTIONS  Watch your condition for any changes. The following actions may help to lessen any discomfort you are feeling:  Follow your health care provider's instructions for limiting your activity. If your health care provider orders bed rest, you may need to stay in bed and only get up to use the bathroom. However, your health care provider may allow you to continue light  activity.  If needed, make plans for someone to help with your regular activities and responsibilities while you are on bed rest.  Keep track of the number of pads you use each day, how often you change pads, and how soaked (saturated) they are. Write this down.  Do not use tampons. Do not douche.  Do not have sexual intercourse or orgasms until approved by your health care provider.  If you pass any tissue from your vagina, save the tissue so you can show it to your health care provider.  Only take over-the-counter or prescription medicines as directed by your health care provider.  Do not take aspirin because it can make you bleed.  Keep all follow-up appointments as directed by your health care provider. SEEK MEDICAL CARE IF:  You have any vaginal bleeding during any part of your pregnancy.  You have cramps or labor pains. SEEK IMMEDIATE MEDICAL CARE IF:   You have severe cramps in your back or belly (abdomen).  You have a fever, not controlled by medicine.  You pass large clots or tissue from your vagina.  Your bleeding increases.  You feel lightheaded or weak, or you have fainting episodes.  You have chills.  You are leaking fluid or have a gush of fluid from your vagina.  You pass out while having a bowel movement. MAKE SURE YOU:  Understand these instructions.  Will watch your condition.  Will get help right away if you are not doing well or get worse. Document Released: 05/27/2005 Document Revised: 06/07/2013 Document Reviewed: 04/24/2013 [facility] Patient Information 2014 [place].

## 2013-10-08 NOTE — MAU Provider Note (Signed)
Reviewed and agree with note and plan. V.Shoshana Johal, MD  

## 2014-01-27 ENCOUNTER — Ambulatory Visit (INDEPENDENT_AMBULATORY_CARE_PROVIDER_SITE_OTHER): Payer: 59 | Admitting: Family Medicine

## 2014-01-27 VITALS — BP 114/72 | HR 98 | Temp 98.0°F | Resp 16 | Ht 62.5 in | Wt 246.8 lb

## 2014-01-27 DIAGNOSIS — M25539 Pain in unspecified wrist: Secondary | ICD-10-CM

## 2014-01-27 DIAGNOSIS — Z3490 Encounter for supervision of normal pregnancy, unspecified, unspecified trimester: Secondary | ICD-10-CM

## 2014-01-27 DIAGNOSIS — Z331 Pregnant state, incidental: Secondary | ICD-10-CM

## 2014-01-27 DIAGNOSIS — M65839 Other synovitis and tenosynovitis, unspecified forearm: Secondary | ICD-10-CM

## 2014-01-27 DIAGNOSIS — M65849 Other synovitis and tenosynovitis, unspecified hand: Secondary | ICD-10-CM

## 2014-01-27 DIAGNOSIS — M25531 Pain in right wrist: Secondary | ICD-10-CM

## 2014-01-27 DIAGNOSIS — M659 Synovitis and tenosynovitis, unspecified: Secondary | ICD-10-CM

## 2014-01-27 NOTE — Patient Instructions (Signed)
De Quervain's Disease Harriet Pho disease is a condition often seen in racquet sports where there is a soreness (inflammation) in the cord like structures (tendons) which attach muscle to bone on the thumb side of the wrist. There may be a tightening of the tissuesaround the tendons. This condition is often helped by giving up or modifying the activity which caused it. When conservative treatment does not help, surgery may be required. Conservative treatment could include changes in the activity which brought about the problem or made it worse. Anti-inflammatory medications and injections may be used to help decrease the inflammation and help with pain control. Your caregiver will help you determine which is best for you. DIAGNOSIS  Often the diagnosis (learning what is wrong) can be made by examination. Sometimes x-rays are required. HOME CARE INSTRUCTIONS   Apply ice to the sore area for 15-20 minutes, 03-04 times per day while awake. Put the ice in a plastic bag and place a towel between the bag of ice and your skin. This is especially helpful if it can be done after all activities involving the sore wrist.  Temporary splinting may help.  Only take over-the-counter or prescription medicines for pain, discomfort or fever as directed by your caregiver. SEEK MEDICAL CARE IF:   Pain relief is not obtained with medications, or if you have increasing pain and seem to be getting worse rather than better. MAKE SURE YOU:   Understand these instructions.  Will watch your condition.  Will get help right away if you are not doing well or get worse. Document Released: 05/12/2001 Document Revised: 11/09/2011 Document Reviewed: 08/17/2005   Wear the splint  Take ibuprofen 600 mg 3 times daily if needed  If symptoms get worse we can refer you to an orthopedist to consider injection, but I would prefer that you try to cut it through pregnancy. [employer] Patient Information 2014 [place].

## 2014-01-27 NOTE — Progress Notes (Signed)
Subjective: 33 year old lady who is here complaining of pain in her right forearm and wrist. This is been going on for 2-1/2 weeks. She is [redacted] weeks pregnant. No known injuries. No major repetitive activities that she is a good deal of computer typing. Has never had this problem. This is her first pregnancy.  Objective: Tender along the right medial aspect of the forearm along the radius. The pain on flexion of the thumb. Otherwise neurovascular intact.  Assessment: Dequervain tenosynovitis secondary to pregnancy Wrist pain 28 week pregnancy   Plan: Splint Can take ibuprofen if needed Hopefully this will stabilize things, but may well continue with problems pregnancy. It could be injected but I would not recommend that at this time pregnancy.

## 2014-04-13 ENCOUNTER — Encounter (HOSPITAL_COMMUNITY): Payer: Self-pay | Admitting: *Deleted

## 2014-04-13 ENCOUNTER — Telehealth (HOSPITAL_COMMUNITY): Payer: Self-pay | Admitting: *Deleted

## 2014-04-13 ENCOUNTER — Other Ambulatory Visit: Payer: Self-pay | Admitting: Obstetrics & Gynecology

## 2014-04-13 NOTE — Telephone Encounter (Signed)
Preadmission screen  

## 2014-04-15 ENCOUNTER — Encounter (HOSPITAL_COMMUNITY): Payer: Self-pay

## 2014-04-15 ENCOUNTER — Encounter (HOSPITAL_COMMUNITY): Payer: 59 | Admitting: Anesthesiology

## 2014-04-15 ENCOUNTER — Inpatient Hospital Stay (HOSPITAL_COMMUNITY): Payer: 59 | Admitting: Anesthesiology

## 2014-04-15 ENCOUNTER — Inpatient Hospital Stay (HOSPITAL_COMMUNITY)
Admission: RE | Admit: 2014-04-15 | Discharge: 2014-04-19 | DRG: 765 | Disposition: A | Discharge status: Home / Self Care | Payer: 59 | Source: Ambulatory Visit | Attending: Obstetrics & Gynecology | Admitting: Obstetrics & Gynecology

## 2014-04-15 DIAGNOSIS — F329 Major depressive disorder, single episode, unspecified: Secondary | ICD-10-CM | POA: Diagnosis present

## 2014-04-15 DIAGNOSIS — E669 Obesity, unspecified: Secondary | ICD-10-CM | POA: Diagnosis present

## 2014-04-15 DIAGNOSIS — O3660X0 Maternal care for excessive fetal growth, unspecified trimester, not applicable or unspecified: Secondary | ICD-10-CM | POA: Diagnosis present

## 2014-04-15 DIAGNOSIS — O324XX0 Maternal care for high head at term, not applicable or unspecified: Secondary | ICD-10-CM | POA: Diagnosis present

## 2014-04-15 DIAGNOSIS — O368190 Decreased fetal movements, unspecified trimester, not applicable or unspecified: Secondary | ICD-10-CM | POA: Diagnosis present

## 2014-04-15 DIAGNOSIS — Z8051 Family history of malignant neoplasm of kidney: Secondary | ICD-10-CM

## 2014-04-15 DIAGNOSIS — K219 Gastro-esophageal reflux disease without esophagitis: Secondary | ICD-10-CM | POA: Diagnosis present

## 2014-04-15 DIAGNOSIS — O9903 Anemia complicating the puerperium: Secondary | ICD-10-CM | POA: Diagnosis not present

## 2014-04-15 DIAGNOSIS — Z823 Family history of stroke: Secondary | ICD-10-CM | POA: Diagnosis not present

## 2014-04-15 DIAGNOSIS — Z825 Family history of asthma and other chronic lower respiratory diseases: Secondary | ICD-10-CM | POA: Diagnosis not present

## 2014-04-15 DIAGNOSIS — Z8249 Family history of ischemic heart disease and other diseases of the circulatory system: Secondary | ICD-10-CM | POA: Diagnosis not present

## 2014-04-15 DIAGNOSIS — Z833 Family history of diabetes mellitus: Secondary | ICD-10-CM

## 2014-04-15 DIAGNOSIS — O360990 Maternal care for other rhesus isoimmunization, unspecified trimester, not applicable or unspecified: Secondary | ICD-10-CM | POA: Diagnosis present

## 2014-04-15 DIAGNOSIS — O139 Gestational [pregnancy-induced] hypertension without significant proteinuria, unspecified trimester: Secondary | ICD-10-CM | POA: Diagnosis present

## 2014-04-15 DIAGNOSIS — O99344 Other mental disorders complicating childbirth: Secondary | ICD-10-CM | POA: Diagnosis present

## 2014-04-15 DIAGNOSIS — D62 Acute posthemorrhagic anemia: Secondary | ICD-10-CM | POA: Diagnosis not present

## 2014-04-15 DIAGNOSIS — Z3490 Encounter for supervision of normal pregnancy, unspecified, unspecified trimester: Secondary | ICD-10-CM

## 2014-04-15 DIAGNOSIS — F3289 Other specified depressive episodes: Secondary | ICD-10-CM | POA: Diagnosis present

## 2014-04-15 DIAGNOSIS — O9989 Other specified diseases and conditions complicating pregnancy, childbirth and the puerperium: Secondary | ICD-10-CM

## 2014-04-15 DIAGNOSIS — J45909 Unspecified asthma, uncomplicated: Secondary | ICD-10-CM | POA: Diagnosis present

## 2014-04-15 DIAGNOSIS — Z22330 Carrier of Group B streptococcus: Secondary | ICD-10-CM | POA: Diagnosis not present

## 2014-04-15 DIAGNOSIS — Z6841 Body Mass Index (BMI) 40.0 and over, adult: Secondary | ICD-10-CM | POA: Diagnosis not present

## 2014-04-15 DIAGNOSIS — O99892 Other specified diseases and conditions complicating childbirth: Secondary | ICD-10-CM | POA: Diagnosis present

## 2014-04-15 DIAGNOSIS — O99214 Obesity complicating childbirth: Secondary | ICD-10-CM | POA: Diagnosis present

## 2014-04-15 DIAGNOSIS — Z8659 Personal history of other mental and behavioral disorders: Secondary | ICD-10-CM

## 2014-04-15 LAB — RPR

## 2014-04-15 LAB — COMPREHENSIVE METABOLIC PANEL
ALBUMIN: 2.6 g/dL — AB (ref 3.5–5.2)
ALT: 6 U/L (ref 0–35)
ANION GAP: 14 (ref 5–15)
AST: 13 U/L (ref 0–37)
Alkaline Phosphatase: 141 U/L — ABNORMAL HIGH (ref 39–117)
BUN: 9 mg/dL (ref 6–23)
CHLORIDE: 102 meq/L (ref 96–112)
CO2: 21 mEq/L (ref 19–32)
Calcium: 9.5 mg/dL (ref 8.4–10.5)
Creatinine, Ser: 0.59 mg/dL (ref 0.50–1.10)
GFR calc Af Amer: >90 mL/min (ref >90)
GFR calc non Af Amer: >90 mL/min (ref >90)
Glucose, Bld: 95 mg/dL (ref 70–99)
Potassium: 4.2 mEq/L (ref 3.7–5.3)
Sodium: 137 mEq/L (ref 137–147)
TOTAL PROTEIN: 6.1 g/dL (ref 6.0–8.3)
Total Bilirubin: <0.2 mg/dL — ABNORMAL LOW (ref 0.3–1.2)

## 2014-04-15 LAB — CBC
HCT: 35.7 % — ABNORMAL LOW (ref 36.0–46.0)
HEMATOCRIT: 35 % — AB (ref 36.0–46.0)
HEMOGLOBIN: 12 g/dL (ref 12.0–15.0)
Hemoglobin: 12 g/dL (ref 12.0–15.0)
MCH: 31.7 pg (ref 26.0–34.0)
MCH: 31.9 pg (ref 26.0–34.0)
MCHC: 33.6 g/dL (ref 30.0–36.0)
MCHC: 34.3 g/dL (ref 30.0–36.0)
MCV: 94.4 fL (ref 78.0–100.0)
MCV: 93.1 fL (ref 78.0–100.0)
PLATELETS: 285 10*3/uL (ref 150–400)
Platelets: 277 10*3/uL (ref 150–400)
RBC: 3.78 MIL/uL — ABNORMAL LOW (ref 3.87–5.11)
RBC: 3.76 MIL/uL — AB (ref 3.87–5.11)
RDW: 14.8 % (ref 11.5–15.5)
RDW: 14.7 % (ref 11.5–15.5)
WBC: 18 10*3/uL — AB (ref 4.0–10.5)
WBC: 19 10*3/uL — ABNORMAL HIGH (ref 4.0–10.5)

## 2014-04-15 LAB — PREPARE RBC (CROSSMATCH)

## 2014-04-15 LAB — URIC ACID: URIC ACID, SERUM: 4.5 mg/dL (ref 2.4–7.0)

## 2014-04-15 MED ORDER — CITRIC ACID-SODIUM CITRATE 334-500 MG/5ML PO SOLN
30.0000 mL | ORAL | Status: DC | PRN
Start: 2014-04-15 — End: 2014-04-16
  Administered 2014-04-15 – 2014-04-16 (×2): 30 mL via ORAL
  Filled 2014-04-15 (×2): qty 15

## 2014-04-15 MED ORDER — LACTATED RINGERS IV SOLN
500.0000 mL | Freq: Once | INTRAVENOUS | Status: AC
  Administered 2014-04-15: 18:00:00 via INTRAVENOUS

## 2014-04-15 MED ORDER — OXYTOCIN 40 UNITS IN LACTATED RINGERS INFUSION - SIMPLE MED
1.0000 m[IU]/min | INTRAVENOUS | Status: DC
  Administered 2014-04-15: 2 m[IU]/min via INTRAVENOUS
  Administered 2014-04-15: 10 m[IU]/min via INTRAVENOUS
  Filled 2014-04-15: qty 1000

## 2014-04-15 MED ORDER — OXYTOCIN BOLUS FROM INFUSION: 500.0000 mL | INTRAVENOUS | Status: DC

## 2014-04-15 MED ORDER — ACETAMINOPHEN 325 MG PO TABS: 650.0000 mg | ORAL_TABLET | ORAL | Status: DC | PRN

## 2014-04-15 MED ORDER — LACTATED RINGERS IV SOLN: 500.0000 mL | INTRAVENOUS | Status: DC | PRN

## 2014-04-15 MED ORDER — IBUPROFEN 600 MG PO TABS
600.0000 mg | ORAL_TABLET | Freq: Four times a day (QID) | ORAL | Status: DC | PRN
Start: 2014-04-15 — End: 2014-04-16

## 2014-04-15 MED ORDER — FENTANYL 2.5 MCG/ML BUPIVACAINE 1/10 % EPIDURAL INFUSION (WH - ANES)
INTRAMUSCULAR | Status: DC | PRN
  Administered 2014-04-15: 14 mL/h via EPIDURAL

## 2014-04-15 MED ORDER — EPHEDRINE 5 MG/ML INJ: 10.0000 mg | INTRAVENOUS | Status: DC | PRN

## 2014-04-15 MED ORDER — ONDANSETRON HCL 4 MG/2ML IJ SOLN: 4.0000 mg | Freq: Four times a day (QID) | INTRAMUSCULAR | Status: DC | PRN

## 2014-04-15 MED ORDER — FAMOTIDINE 20 MG PO TABS
20.0000 mg | ORAL_TABLET | Freq: Every day | ORAL | Status: DC
  Administered 2014-04-15: 20 mg via ORAL
  Filled 2014-04-15: qty 1

## 2014-04-15 MED ORDER — PENICILLIN G POTASSIUM 5000000 UNITS IJ SOLR
2.5000 10*6.[IU] | INTRAVENOUS | Status: DC
  Administered 2014-04-15 – 2014-04-16 (×4): 2.5 10*6.[IU] via INTRAVENOUS
  Filled 2014-04-15 (×8): qty 2.5

## 2014-04-15 MED ORDER — TERBUTALINE SULFATE 1 MG/ML IJ SOLN: 0.2500 mg | Freq: Once | INTRAMUSCULAR | Status: AC | PRN

## 2014-04-15 MED ORDER — LIDOCAINE HCL (PF) 1 % IJ SOLN
INTRAMUSCULAR | Status: DC | PRN
  Administered 2014-04-15 (×2): 4 mL

## 2014-04-15 MED ORDER — OXYCODONE-ACETAMINOPHEN 5-325 MG PO TABS: 1.0000 | ORAL_TABLET | ORAL | Status: DC | PRN

## 2014-04-15 MED ORDER — LIDOCAINE HCL (PF) 1 % IJ SOLN
30.0000 mL | INTRAMUSCULAR | Status: DC | PRN
  Filled 2014-04-15: qty 30

## 2014-04-15 MED ORDER — LACTATED RINGERS IV SOLN
INTRAVENOUS | Status: DC
  Administered 2014-04-15 (×2): via INTRAVENOUS

## 2014-04-15 MED ORDER — PENICILLIN G POTASSIUM 5000000 UNITS IJ SOLR
5.0000 10*6.[IU] | Freq: Once | INTRAVENOUS | Status: AC
  Administered 2014-04-15: 5 10*6.[IU] via INTRAVENOUS
  Filled 2014-04-15: qty 5

## 2014-04-15 MED ORDER — DIPHENHYDRAMINE HCL 50 MG/ML IJ SOLN: 12.5000 mg | INTRAMUSCULAR | Status: DC | PRN

## 2014-04-15 MED ORDER — PHENYLEPHRINE 40 MCG/ML (10ML) SYRINGE FOR IV PUSH (FOR BLOOD PRESSURE SUPPORT): 80.0000 ug | PREFILLED_SYRINGE | INTRAVENOUS | Status: DC | PRN

## 2014-04-15 MED ORDER — OXYTOCIN 40 UNITS IN LACTATED RINGERS INFUSION - SIMPLE MED: 62.5000 mL/h | INTRAVENOUS | Status: DC

## 2014-04-15 MED ORDER — FENTANYL 2.5 MCG/ML BUPIVACAINE 1/10 % EPIDURAL INFUSION (WH - ANES)
14.0000 mL/h | INTRAMUSCULAR | Status: DC | PRN
  Administered 2014-04-16: 14 mL/h via EPIDURAL
  Filled 2014-04-15 (×2): qty 125

## 2014-04-15 MED ORDER — FLUOXETINE HCL 20 MG PO CAPS
40.0000 mg | ORAL_CAPSULE | Freq: Every day | ORAL | Status: DC
  Administered 2014-04-15: 40 mg via ORAL
  Filled 2014-04-15 (×2): qty 2

## 2014-04-15 MED ORDER — PHENYLEPHRINE 40 MCG/ML (10ML) SYRINGE FOR IV PUSH (FOR BLOOD PRESSURE SUPPORT)
80.0000 ug | PREFILLED_SYRINGE | INTRAVENOUS | Status: DC | PRN
  Filled 2014-04-15: qty 10

## 2014-04-15 NOTE — Progress Notes (Signed)
Late entry- seen at 9 am  Pt notes contractions since last night, mild discomfort. Good FM, no LOF. No c/o HA.  PE: Vitals noted Gen: well appearing, no distress Abd: gravid, no RUQ pain, no fundal tenderness. EFw 9# LE: 2+ edema  CBC    Component Value Date/Time   WBC 18.0* 04/15/2014 0750   RBC 3.78* 04/15/2014 0750   HGB 12.0 04/15/2014 0750   HCT 35.7* 04/15/2014 0750   PLT 285 04/15/2014 0750   MCV 94.4 04/15/2014 0750   MCH 31.7 04/15/2014 0750   MCHC 33.6 04/15/2014 0750   RDW 14.8 04/15/2014 0750     A/P: IOL. Plan pitocin, AROM when active - watch bp's [place], may need to repeat [place] labs (normal in office prior) - Reactive fetal testing - LGA - GBS pos, on PCN  Rosella Crandell A. 04/15/2014 10:42 AM

## 2014-04-15 NOTE — Anesthesia Procedure Notes (Signed)
Epidural Patient location during procedure: OB Start time: 04/15/2014 5:55 PM  Staffing Anesthesiologist: Nahara Dona A. Performed by: anesthesiologist   Preanesthetic Checklist Completed: patient identified, site marked, surgical consent, pre-op evaluation, timeout performed, IV checked, risks and benefits discussed and monitors and equipment checked  Epidural Patient position: sitting Prep: site prepped and draped and DuraPrep Patient monitoring: continuous pulse ox and blood pressure Approach: midline Location: L3-L4 Injection technique: LOR air  Needle:  Needle type: Tuohy  Needle gauge: 17 G Needle length: 9 cm and 9 Needle insertion depth: 7 cm Catheter type: closed end flexible Catheter size: 19 Gauge Catheter at skin depth: 12 cm Test dose: negative and Other  Assessment Events: blood not aspirated, injection not painful, no injection resistance, negative IV test and no paresthesia  Additional Notes Patient identified. Risks and benefits discussed including failed block, incomplete  Pain control, post dural puncture headache, nerve damage, paralysis, blood pressure Changes, nausea, vomiting, reactions to medications-both toxic and allergic and post Partum back pain. All questions were answered. Patient expressed understanding and wished to proceed. Sterile technique was used throughout procedure. Epidural site was Dressed with sterile barrier dressing. No paresthesias, signs of intravascular injection Or signs of intrathecal spread were encountered.  Patient was more comfortable after the epidural was dosed. Please see RN's note for documentation of vital signs and FHR which are stable.

## 2014-04-15 NOTE — Progress Notes (Signed)
Lab called and requested for CBC.

## 2014-04-15 NOTE — H&P (Signed)
Melissa May is a 33 y.o. female presenting for labor IOL for LGA, decreased fetal movements. Patient started having stronger contractions last night, denies leaking/ bleeding.  [facility] from 8 wks, Dr Benjie Karvonen primary. Depression (Prozac 40mg ), Asthma (stable, off and on inhaler use), GERD (Pepcid), GBS positive.  LGA by sonogram since 28 wks but no GDM. ELevated BPs since 36 wks without HTN. Rh negative, s/p Rhogam (1st trim for bleeding and at 28 wlks), s/p TDAP.   History OB History   Grav Para Term Preterm Abortions TAB SAB Ect Mult Living   1              Past Medical History  Diagnosis Date  . GERD (gastroesophageal reflux disease)   . Depression   . Seasonal allergies   . Asthma     prn inhaler  . TMJ syndrome   . 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 Obese    Past Surgical History  Procedure Laterality Date  . Upper gastrointestinal endoscopy  2009  . Wisdom tooth extraction    . Strabismus surgery      as an infant   Family History: family history includes Allergies in her brother; Asthma in her paternal grandmother; Cancer in her father; Diabetes in her paternal grandmother; Heart disease in her maternal grandfather; Hypertension in her father and paternal grandmother; Kidney cancer in her father; Rheum arthritis in her maternal grandmother and mother; Stroke in her maternal grandmother. Social History:  reports that she has never smoked. She has never used smokeless tobacco. She reports that she does not drink alcohol or use illicit drugs.   Prenatal Transfer Tool  Maternal Diabetes: No Genetic Screening: Normal QUAD neg  Maternal Ultrasounds/Referrals: Normal Fetal Ultrasounds or other Referrals:  None Maternal Substance Abuse:  No Significant Maternal Medications:  Prozac, Pepcid, Albuterol inhaler prn.  Significant Maternal Lab Results:  Lab values include: Group B Strep positive, Rh negative Other Comments:  EFW 8'14" at 91% and AC at 98% on  8/14  ROS neg PEC s/s.   Dilation: 3 Effacement (%): 80 Station: -2 Exam by:: [email or web address] Blood pressure 152/88, pulse 83, temperature 97.9 F (36.6 C), temperature source Oral, resp. rate 20, height 5' 2.5" (1.588 m), weight 258 lb (117.028 kg), last menstrual period 07/17/2013. Exam Physical Exam   A&O x 3, no acute distress. Pleasant HEENT neg, no thyromegaly Lungs CTA bilat CV RRR, A1S2 normal Abdo soft, non tender, non acute Extr no edema/ tenderness Pelvic above FHT 140s - category I Toco regular q 3-4 min . Prenatal labs: ABO, Rh: --/--/O NEG (12/29 1312) Antibody: Positive (01/16 0000) Rubella: Nonimmune (01/16 0000) RPR: Nonreactive (01/16 0000)  HBsAg: Negative (01/16 0000)  HIV: Non-reactive (01/16 0000)  GBS: Positive (01/16 0000)  Glucola normal  Assessment/Plan: 33 yo, G1 at 39.5 wks here for labor induction due to LGA, decreased FMs for several days (normal BPP) and elevated BPs without HTN. Depression, Asthma stable. Continue Prozac, Pepcid PP.  Plan PCN per protocol, AROM after 2nd dose, Pitocin low dose as needed. Epidural ok per request. Anticipate 8.1/2 - 9 lbs, SVD.     Nalee Lightle R 04/15/2014, 9:52 AM

## 2014-04-15 NOTE — Progress Notes (Signed)
DOREAN HIEBERT is a 33 y.o. G1P0 at [redacted]w[redacted]d, labor augmentation.  Subjective: Feels well, epidural helping. SOB if supine, comfortable propped up   Objective: BP 106/59  Pulse 94  Temp(Src) 98.4 F (36.9 C) (Oral)  Resp 18  Ht 5' 2.5" (1.588 m)  Wt 258 lb (117.028 kg)  BMI 46.41 kg/m2  SpO2 100%  LMP 07/17/2013    FHT:  FHR: 145-150 bpm, variability: moderate,  accelerations:  Present,  decelerations:  Present occ variable decels, didnt tolerate any lateral positions, noted variable decel with return in 2 min. UC:   regular, every 2-3 minutes SVE:   Dilation: 7 Effacement (%): 90 Station: -1;0 Exam by:: Dr. Benjie Karvonen narrow pubic arch Labs: Lab Results  Component Value Date   WBC 19.0* 04/15/2014   HGB 12.0 04/15/2014   HCT 35.0* 04/15/2014   MCV 93.1 04/15/2014   PLT 277 04/15/2014    Assessment / Plan: Augmentation of labor, progressing well  Labor: Progressing normally Fetal Wellbeing:  Category I Pain Control:  Epidural I/D:  GBS on PCN  Anticipated MOD:  NSVD, guarded due to [place] R 04/15/2014, 9:56 PM

## 2014-04-15 NOTE — Anesthesia Preprocedure Evaluation (Signed)
Anesthesia Evaluation  Patient identified by MRN, date of birth, ID band Patient awake    Reviewed: Allergy & Precautions, H&P , Patient's Chart, lab work & pertinent test results  Airway Mallampati: III TM Distance: >3 FB Neck ROM: Full    Dental no notable dental hx. (+) Teeth Intact   Pulmonary asthma ,  breath sounds clear to auscultation  Pulmonary exam normal       Cardiovascular negative cardio ROS  Rhythm:Regular Rate:Normal     Neuro/Psych PSYCHIATRIC DISORDERS Depression negative neurological ROS     GI/Hepatic Neg liver ROS, GERD-  Medicated and Controlled,  Endo/Other  Morbid obesity  Renal/GU negative Renal ROS  negative genitourinary   Musculoskeletal negative musculoskeletal ROS (+)   Abdominal (+) + obese,   Peds  Hematology negative hematology ROS (+)   Anesthesia Other Findings   Reproductive/Obstetrics (+) Pregnancy                           Anesthesia Physical Anesthesia Plan  ASA: III  Anesthesia Plan: Epidural   Post-op Pain Management:    Induction:   Airway Management Planned: Natural Airway  Additional Equipment:   Intra-op Plan:   Post-operative Plan:   Informed Consent: I have reviewed the patients History and Physical, chart, labs and discussed the procedure including the risks, benefits and alternatives for the proposed anesthesia with the patient or authorized representative who has indicated his/her understanding and acceptance.     Plan Discussed with: Anesthesiologist  Anesthesia Plan Comments:         Anesthesia Quick Evaluation

## 2014-04-15 NOTE — Progress Notes (Signed)
Patient ID: Melissa May, female   DOB: 03-18-1981, 33 y.o.   MRN: [id number] Came in to evaluate FHT due to repetitive variable decels   VS stable, BPs stable.  FHT:  FHR: 150 bpm, variability: minimal ,  accelerations:  Present,  decelerations:  Present repetitive variable decels  UC:   Regular, pitocin turned off, now every 3-4 min SVE:   Dilation: 8 Effacement (%): 90 Station: -1;0 Exam by:: Sinda Du, RN Same exam as RN. Direct OP position but baby does not tolerate lateral positions.   Assessment / Plan: Augmentation of labor, progressing well  Fetal Wellbeing:  Category II pitocin off, reassess and reatart in 43min if FHT back in category I Pain Control:  Epidural  Anticipated MOD:  Guarded, pending fetal tolerance and descent  Meilani Edmundson R 04/15/2014, 11:04 PM

## 2014-04-15 NOTE — Progress Notes (Signed)
CBC ordered for epidural.

## 2014-04-15 NOTE — Progress Notes (Signed)
S: Doing well, no complaints, pain well controlled with minimal contractions, starting to be more uncomfortable over the past hr. No HA, no vision change, no RUQ pain.  O: BP 151/106  Pulse 93  Temp(Src) 98 F (36.7 C) (Oral)  Resp 18  Ht 5' 2.5" (1.588 m)  Wt 117.028 kg (258 lb)  BMI 46.41 kg/m2  LMP 07/17/2013   FHT:  FHR: 140s bpm, variability: moderate,  accelerations:  Present,  decelerations:  Absent UC:   irregular, every 2-6 minutes SVE:   Dilation: 4 Effacement (%): 80 Station: -2 Exam by:: dr Pamala Hurry AROM thick meconium CV: RRR Pulm: CTAB Abd: gravid, NT LE: 3+ edema, DTR 2+   May / P:  33 y.o.  Obstetric History   G1   P0   T0   P0   A0   TAB0   SAB0   E0   M0   L0    at [redacted]w[redacted]d IOL, LGA Gest htn- elevated bps, exam/ labs neg for PEC, no symptoms. Will treat high bp's as needed. Most recent elevated bp was during contraction.   Fetal Wellbeing:  Category I Pain Control:  Labor support without medications  Anticipated MOD:  NSVD  Melissa May. 04/15/2014, 4:24 PM

## 2014-04-16 ENCOUNTER — Encounter (HOSPITAL_COMMUNITY): Payer: Self-pay

## 2014-04-16 ENCOUNTER — Encounter (HOSPITAL_COMMUNITY)
Admission: RE | Disposition: A | Discharge status: Home / Self Care | Payer: Self-pay | Source: Ambulatory Visit | Attending: Obstetrics & Gynecology

## 2014-04-16 SURGERY — Surgical Case
Anesthesia: Epidural | Site: Abdomen

## 2014-04-16 MED ORDER — PRENATAL MULTIVITAMIN CH
1.0000 | ORAL_TABLET | Freq: Every day | ORAL | Status: DC
  Administered 2014-04-16 – 2014-04-19 (×4): 1 via ORAL
  Filled 2014-04-16 (×6): qty 1

## 2014-04-16 MED ORDER — LANOLIN HYDROUS EX OINT
1.0000 | TOPICAL_OINTMENT | CUTANEOUS | Status: DC | PRN
Start: 2014-04-16 — End: 2014-04-19

## 2014-04-16 MED ORDER — IBUPROFEN 600 MG PO TABS
600.0000 mg | ORAL_TABLET | Freq: Four times a day (QID) | ORAL | Status: DC
  Administered 2014-04-16 – 2014-04-19 (×11): 600 mg via ORAL
  Filled 2014-04-16 (×11): qty 1

## 2014-04-16 MED ORDER — MORPHINE SULFATE (PF) 0.5 MG/ML IJ SOLN
INTRAMUSCULAR | Status: DC | PRN
  Administered 2014-04-16: .5 mg via EPIDURAL
  Administered 2014-04-16: .5 mg via INTRAVENOUS

## 2014-04-16 MED ORDER — DIBUCAINE 1 % RE OINT
1.0000 | TOPICAL_OINTMENT | RECTAL | Status: DC | PRN
Start: 2014-04-16 — End: 2014-04-19
  Filled 2014-04-16: qty 28

## 2014-04-16 MED ORDER — DIPHENHYDRAMINE HCL 50 MG/ML IJ SOLN: 12.5000 mg | INTRAMUSCULAR | Status: DC | PRN

## 2014-04-16 MED ORDER — CEFAZOLIN SODIUM-DEXTROSE 2-3 GM-% IV SOLR
INTRAVENOUS | Status: AC
  Filled 2014-04-16: qty 50

## 2014-04-16 MED ORDER — KETOROLAC TROMETHAMINE 30 MG/ML IJ SOLN: 30.0000 mg | Freq: Four times a day (QID) | INTRAMUSCULAR | Status: AC | PRN

## 2014-04-16 MED ORDER — OXYTOCIN 10 UNIT/ML IJ SOLN
INTRAMUSCULAR | Status: AC
  Filled 2014-04-16: qty 4

## 2014-04-16 MED ORDER — ONDANSETRON HCL 4 MG/2ML IJ SOLN: 4.0000 mg | INTRAMUSCULAR | Status: DC | PRN

## 2014-04-16 MED ORDER — SIMETHICONE 80 MG PO CHEW
80.0000 mg | CHEWABLE_TABLET | ORAL | Status: DC
Start: 2014-04-17 — End: 2014-04-19
  Administered 2014-04-16 – 2014-04-18 (×2): 80 mg via ORAL
  Filled 2014-04-16 (×4): qty 1

## 2014-04-16 MED ORDER — ACETAMINOPHEN 500 MG PO TABS
1000.0000 mg | ORAL_TABLET | Freq: Four times a day (QID) | ORAL | Status: AC
  Administered 2014-04-16 (×2): 1000 mg via ORAL
  Filled 2014-04-16 (×2): qty 2

## 2014-04-16 MED ORDER — TETANUS-DIPHTH-ACELL PERTUSSIS 5-2.5-18.5 LF-MCG/0.5 IM SUSP
0.5000 mL | Freq: Once | INTRAMUSCULAR | Status: DC
Start: 2014-04-17 — End: 2014-04-16
  Filled 2014-04-16: qty 0.5

## 2014-04-16 MED ORDER — DIPHENHYDRAMINE HCL 50 MG/ML IJ SOLN: 25.0000 mg | INTRAMUSCULAR | Status: DC | PRN

## 2014-04-16 MED ORDER — ONDANSETRON HCL 4 MG PO TABS: 4.0000 mg | ORAL_TABLET | ORAL | Status: DC | PRN

## 2014-04-16 MED ORDER — MEPERIDINE HCL 25 MG/ML IJ SOLN: 6.2500 mg | INTRAMUSCULAR | Status: DC | PRN

## 2014-04-16 MED ORDER — SCOPOLAMINE 1 MG/3DAYS TD PT72
MEDICATED_PATCH | TRANSDERMAL | Status: DC | PRN
  Administered 2014-04-16: 1 via TRANSDERMAL

## 2014-04-16 MED ORDER — MENTHOL 3 MG MT LOZG
1.0000 | LOZENGE | OROMUCOSAL | Status: DC | PRN
  Filled 2014-04-16: qty 9

## 2014-04-16 MED ORDER — NALOXONE HCL 0.4 MG/ML IJ SOLN: 0.4000 mg | INTRAMUSCULAR | Status: DC | PRN

## 2014-04-16 MED ORDER — PHENYLEPHRINE 8 MG IN D5W 100 ML (0.08MG/ML) PREMIX OPTIME
INJECTION | INTRAVENOUS | Status: AC
  Filled 2014-04-16: qty 100

## 2014-04-16 MED ORDER — PROMETHAZINE HCL 25 MG/ML IJ SOLN: 6.2500 mg | INTRAMUSCULAR | Status: DC | PRN

## 2014-04-16 MED ORDER — SODIUM CHLORIDE 0.9 % IJ SOLN: 3.0000 mL | INTRAMUSCULAR | Status: DC | PRN

## 2014-04-16 MED ORDER — FAMOTIDINE 20 MG PO TABS
10.0000 mg | ORAL_TABLET | Freq: Every day | ORAL | Status: DC
  Administered 2014-04-16 – 2014-04-18 (×3): 10 mg via ORAL
  Filled 2014-04-16 (×5): qty 1

## 2014-04-16 MED ORDER — KETOROLAC TROMETHAMINE 30 MG/ML IJ SOLN
30.0000 mg | Freq: Four times a day (QID) | INTRAMUSCULAR | Status: AC | PRN
  Administered 2014-04-16: 30 mg via INTRAVENOUS

## 2014-04-16 MED ORDER — LACTATED RINGERS IV SOLN
INTRAVENOUS | Status: DC | PRN
  Administered 2014-04-16 (×2): via INTRAVENOUS

## 2014-04-16 MED ORDER — MORPHINE SULFATE 0.5 MG/ML IJ SOLN
INTRAMUSCULAR | Status: AC
  Filled 2014-04-16: qty 10

## 2014-04-16 MED ORDER — IBUPROFEN 600 MG PO TABS
600.0000 mg | ORAL_TABLET | Freq: Four times a day (QID) | ORAL | Status: DC | PRN
  Administered 2014-04-17: 600 mg via ORAL
  Filled 2014-04-16: qty 1

## 2014-04-16 MED ORDER — WITCH HAZEL-GLYCERIN EX PADS: 1.0000 "application " | MEDICATED_PAD | CUTANEOUS | Status: DC | PRN

## 2014-04-16 MED ORDER — SODIUM BICARBONATE 8.4 % IV SOLN
INTRAVENOUS | Status: DC | PRN
  Administered 2014-04-16 (×3): 5 mL via EPIDURAL

## 2014-04-16 MED ORDER — NALBUPHINE HCL 10 MG/ML IJ SOLN: 5.0000 mg | INTRAMUSCULAR | Status: DC | PRN

## 2014-04-16 MED ORDER — SENNOSIDES-DOCUSATE SODIUM 8.6-50 MG PO TABS
2.0000 | ORAL_TABLET | ORAL | Status: DC
Start: 2014-04-17 — End: 2014-04-19
  Administered 2014-04-16 – 2014-04-18 (×3): 2 via ORAL
  Filled 2014-04-16 (×5): qty 2

## 2014-04-16 MED ORDER — SIMETHICONE 80 MG PO CHEW
80.0000 mg | CHEWABLE_TABLET | Freq: Three times a day (TID) | ORAL | Status: DC
  Administered 2014-04-16 – 2014-04-19 (×5): 80 mg via ORAL
  Filled 2014-04-16 (×11): qty 1

## 2014-04-16 MED ORDER — DIPHENHYDRAMINE HCL 25 MG PO CAPS
25.0000 mg | ORAL_CAPSULE | Freq: Four times a day (QID) | ORAL | Status: DC | PRN
Start: 2014-04-16 — End: 2014-04-19
  Administered 2014-04-16 (×2): 25 mg via ORAL
  Filled 2014-04-16 (×2): qty 1

## 2014-04-16 MED ORDER — FLUOXETINE HCL 20 MG PO CAPS
40.0000 mg | ORAL_CAPSULE | Freq: Every day | ORAL | Status: DC
  Administered 2014-04-16 – 2014-04-19 (×4): 40 mg via ORAL
  Filled 2014-04-16 (×6): qty 2

## 2014-04-16 MED ORDER — DIPHENHYDRAMINE HCL 25 MG PO CAPS
25.0000 mg | ORAL_CAPSULE | ORAL | Status: DC | PRN
Start: 2014-04-16 — End: 2014-04-19
  Filled 2014-04-16 (×2): qty 1

## 2014-04-16 MED ORDER — MORPHINE SULFATE (PF) 0.5 MG/ML IJ SOLN
INTRAMUSCULAR | Status: DC | PRN
  Administered 2014-04-16: 4 mg via EPIDURAL

## 2014-04-16 MED ORDER — KETOROLAC TROMETHAMINE 30 MG/ML IJ SOLN
INTRAMUSCULAR | Status: AC
  Filled 2014-04-16: qty 1

## 2014-04-16 MED ORDER — SIMETHICONE 80 MG PO CHEW
80.0000 mg | CHEWABLE_TABLET | ORAL | Status: DC | PRN
  Administered 2014-04-17: 80 mg via ORAL
  Filled 2014-04-16: qty 1

## 2014-04-16 MED ORDER — FENTANYL CITRATE 0.05 MG/ML IJ SOLN: 25.0000 ug | INTRAMUSCULAR | Status: DC | PRN

## 2014-04-16 MED ORDER — SCOPOLAMINE 1 MG/3DAYS TD PT72
MEDICATED_PATCH | TRANSDERMAL | Status: AC
  Filled 2014-04-16: qty 1

## 2014-04-16 MED ORDER — OXYCODONE-ACETAMINOPHEN 5-325 MG PO TABS
1.0000 | ORAL_TABLET | ORAL | Status: DC | PRN
  Administered 2014-04-17 (×2): 2 via ORAL
  Administered 2014-04-18 – 2014-04-19 (×4): 1 via ORAL
  Filled 2014-04-16 (×2): qty 1
  Filled 2014-04-16: qty 2
  Filled 2014-04-16 (×2): qty 1
  Filled 2014-04-16: qty 2

## 2014-04-16 MED ORDER — NALOXONE HCL 1 MG/ML IJ SOLN
1.0000 ug/kg/h | INTRAVENOUS | Status: DC | PRN
  Filled 2014-04-16: qty 2

## 2014-04-16 MED ORDER — CEFAZOLIN SODIUM-DEXTROSE 2-3 GM-% IV SOLR
INTRAVENOUS | Status: DC | PRN
  Administered 2014-04-16: 2 g via INTRAVENOUS

## 2014-04-16 MED ORDER — CEFAZOLIN SODIUM-DEXTROSE 2-3 GM-% IV SOLR
2.0000 g | INTRAVENOUS | Status: DC
  Filled 2014-04-16: qty 50

## 2014-04-16 MED ORDER — LACTATED RINGERS IV SOLN
INTRAVENOUS | Status: DC
  Administered 2014-04-16: 11:00:00 via INTRAVENOUS

## 2014-04-16 MED ORDER — ONDANSETRON HCL 4 MG/2ML IJ SOLN
INTRAMUSCULAR | Status: DC | PRN
  Administered 2014-04-16: 4 mg via INTRAVENOUS

## 2014-04-16 MED ORDER — ONDANSETRON HCL 4 MG/2ML IJ SOLN
INTRAMUSCULAR | Status: AC
  Filled 2014-04-16: qty 2

## 2014-04-16 MED ORDER — PHENYLEPHRINE 8 MG IN D5W 100 ML (0.08MG/ML) PREMIX OPTIME
INJECTION | INTRAVENOUS | Status: DC | PRN
  Administered 2014-04-16: 60 ug/min via INTRAVENOUS

## 2014-04-16 MED ORDER — OXYTOCIN 40 UNITS IN LACTATED RINGERS INFUSION - SIMPLE MED
62.5000 mL/h | INTRAVENOUS | Status: AC
Start: 2014-04-16 — End: 2014-04-16

## 2014-04-16 MED ORDER — ONDANSETRON HCL 4 MG/2ML IJ SOLN: 4.0000 mg | Freq: Three times a day (TID) | INTRAMUSCULAR | Status: DC | PRN

## 2014-04-16 MED ORDER — MIDAZOLAM HCL 2 MG/2ML IJ SOLN: 0.5000 mg | Freq: Once | INTRAMUSCULAR | Status: DC | PRN

## 2014-04-16 MED ORDER — LORATADINE 10 MG PO TABS
10.0000 mg | ORAL_TABLET | Freq: Every day | ORAL | Status: DC
  Filled 2014-04-16 (×5): qty 1

## 2014-04-16 MED ORDER — SCOPOLAMINE 1 MG/3DAYS TD PT72: 1.0000 | MEDICATED_PATCH | Freq: Once | TRANSDERMAL | Status: DC

## 2014-04-16 MED ORDER — MEPERIDINE HCL 25 MG/ML IJ SOLN
6.2500 mg | INTRAMUSCULAR | Status: DC | PRN
Start: 2014-04-16 — End: 2014-04-16

## 2014-04-16 MED ORDER — METOCLOPRAMIDE HCL 5 MG/ML IJ SOLN: 10.0000 mg | Freq: Three times a day (TID) | INTRAMUSCULAR | Status: DC | PRN

## 2014-04-16 SURGICAL SUPPLY — 43 items
APL SKNCLS STERI-STRIP NONHPOA (GAUZE/BANDAGES/DRESSINGS) ×1
BENZOIN TINCTURE PRP APPL 2/3 (GAUZE/BANDAGES/DRESSINGS) ×2 IMPLANT
BLADE SURG 10 STRL SS (BLADE) ×6 IMPLANT
CLAMP CORD UMBIL (MISCELLANEOUS) IMPLANT
CLOSURE WOUND 1/2 X4 (GAUZE/BANDAGES/DRESSINGS) ×1
CLOTH BEACON ORANGE TIMEOUT ST (SAFETY) ×3 IMPLANT
CONTAINER PREFILL 10% NBF 15ML (MISCELLANEOUS) IMPLANT
DRAPE LG THREE QUARTER DISP (DRAPES) IMPLANT
DRSG OPSITE POSTOP 4X10 (GAUZE/BANDAGES/DRESSINGS) ×3 IMPLANT
DURAPREP 26ML APPLICATOR (WOUND CARE) ×3 IMPLANT
ELECT REM PT RETURN 9FT ADLT (ELECTROSURGICAL) ×3
ELECTRODE REM PT RTRN 9FT ADLT (ELECTROSURGICAL) ×1 IMPLANT
EXTRACTOR VACUUM KIWI (MISCELLANEOUS) IMPLANT
EXTRACTOR VACUUM M CUP 4 TUBE (SUCTIONS) IMPLANT
EXTRACTOR VACUUM M CUP 4' TUBE (SUCTIONS)
GLOVE BIO SURGEON STRL SZ7 (GLOVE) ×3 IMPLANT
GLOVE BIOGEL PI IND STRL 7.0 (GLOVE) ×1 IMPLANT
GLOVE BIOGEL PI INDICATOR 7.0 (GLOVE) ×2
GOWN STRL REUS W/TWL LRG LVL3 (GOWN DISPOSABLE) ×6 IMPLANT
KIT ABG SYR 3ML LUER SLIP (SYRINGE) IMPLANT
NDL HYPO 25X5/8 SAFETYGLIDE (NEEDLE) IMPLANT
NEEDLE HYPO 25X5/8 SAFETYGLIDE (NEEDLE) IMPLANT
NS IRRIG 1000ML POUR BTL (IV SOLUTION) ×3 IMPLANT
PACK C SECTION WH (CUSTOM PROCEDURE TRAY) ×3 IMPLANT
PAD ABD 8X7 1/2 STERILE (GAUZE/BANDAGES/DRESSINGS) ×2 IMPLANT
PAD OB MATERNITY 4.3X12.25 (PERSONAL CARE ITEMS) ×3 IMPLANT
RTRCTR C-SECT PINK 25CM LRG (MISCELLANEOUS) IMPLANT
STAPLER VISISTAT 35W (STAPLE) IMPLANT
STRIP CLOSURE SKIN 1/2X4 (GAUZE/BANDAGES/DRESSINGS) ×1 IMPLANT
SUT MON AB-0 CT1 36 (SUTURE) ×9 IMPLANT
SUT PLAIN 0 NONE (SUTURE) IMPLANT
SUT PLAIN 2 0 (SUTURE)
SUT PLAIN 2 0 XLH (SUTURE) ×3 IMPLANT
SUT PLAIN ABS 2-0 CT1 27XMFL (SUTURE) IMPLANT
SUT VIC AB 0 CT1 27 (SUTURE) ×6
SUT VIC AB 0 CT1 27XBRD ANBCTR (SUTURE) ×2 IMPLANT
SUT VIC AB 2-0 CT1 27 (SUTURE) ×6
SUT VIC AB 2-0 CT1 TAPERPNT 27 (SUTURE) ×2 IMPLANT
SUT VIC AB 4-0 KS 27 (SUTURE) ×2 IMPLANT
SUT VICRYL 0 TIES 12 18 (SUTURE) IMPLANT
TOWEL OR 17X24 6PK STRL BLUE (TOWEL DISPOSABLE) ×3 IMPLANT
TRAY FOLEY CATH 14FR (SET/KITS/TRAYS/PACK) IMPLANT
WATER STERILE IRR 1000ML POUR (IV SOLUTION) ×3 IMPLANT

## 2014-04-16 NOTE — Anesthesia Postprocedure Evaluation (Signed)
  Anesthesia Post-op Note  Patient: Melissa May  Procedure(s) Performed: Procedure(s): CESAREAN SECTION (N/A)  Patient Location: [facility] Unit  Anesthesia Type:Epidural  Level of Consciousness: awake  Airway and Oxygen Therapy: Patient Spontanous Breathing  Post-op Pain: mild  Post-op Assessment: Patient's Cardiovascular Status Stable and Respiratory Function Stable  Post-op Vital Signs: stable  Last Vitals:  Filed Vitals:   04/16/14 1430  BP: 122/61  Pulse: 93  Temp: 36.8 C  Resp: 20    Complications: No apparent anesthesia complications

## 2014-04-16 NOTE — Op Note (Signed)
Cesarean Section Procedure Note 04/16/2014  Melissa May  Procedure: Primary Low Transverse C-section  Indications: Dystocia   Pre-operative Diagnosis: Arrest of Decent in stage II of labor, Left occiput transverse to occiput posterior position, meconium in amniotic fluid   Post-operative Diagnosis: Same   Surgeon:  Elveria Royals, MD - Primary   Assistants: Laury Deep, CNM   Anesthesia: Epidural   Procedure Details:  The patient was seen in the Labor Room. Persistent occiput transverse position noted at 0 to -1 station with non rotation and no descent. Fetal intolerance to pitocin and pushing. Decision was made to proceed with Cesarean section, risks, benefits, complications, treatment options, and expected outcomes were discussed with the patient. The patient concurred with the proposed plan, giving informed consent. identified as Melissa May and the procedure verified as C-Section Delivery. A Time Out was held and the above information confirmed. 2 gm Ancef given. After induction of anesthesia, the patient was draped and prepped in the usual sterile manner. Foley was draining concentrated slightly blood tinged urine. A Pfannenstiel iIncision was made and carried down through the subcutaneous tissue to the fascia. Fascial incision was made and extended transversely. The fascia was separated from the underlying rectus tissue superiorly and inferiorly. The peritoneum was identified and entered. Peritoneal incision was extended longitudinally. Alexis- O retractor was placed and bowel was packed with wet sponge that was tagged. The utero-vesical peritoneal reflection was incised transversely and the bladder flap was bluntly freed from the lower uterine segment. A low transverse uterine incision was made. Baby was in occiput transverse position and rotation was attempted but head was extended in a tight mid-pelvis. After several attempts my fingers could reach under the head but flexion was  still difficult. So the operating room circulating RN wore a sterile glove and helped with head elevation per vagina and that flexed the head and then head delivery was achieved. Thick pea soup meconium noted. Infant was delivered and cord clamped and cut and was handed to NICU team in attendance.  Birth time 4.03 am on 04/16/14, Apgars 5, 7, 7 at 1,5,10 minutes Cord arterial gas sent (pH was at 7.24 ). Cord blood sent.  The placenta was removed Intact and appeared normal. The uterine outline noted right angle extension inferiorly toward the the cervix. Tubes and ovaries appeared normal. The uterine incision was closed with 0 Monocryl, first the extension with running locked sutures of 0Monocryl, then the entire repair done in 2nd layer.Marland Kitchen   Hemostasis was observed. Alexis O retractor was removed. Perineal closure done with 2-0 Vicryl. The fascia was then reapproximated with running sutures of 0Vicryl. The subcuticular closure was performed using 2-0plain gut. The skin was closed with 4-0Vicryl.   Instrument, sponge, and needle counts were correct prior the abdominal closure and were correct at the conclusion of the case.   Findings: Female infant delivered at 4.03 am on 5/63/89 cephalic after some difficulty and needing push up per vagina to help with flexion of extended OP/LOT head. Apgars 5, 7, 7 at 1,5,10 minutes. Cord arterial gas 7.24. Thick meconium but none under the cord per Neonatologist. Right sided hysterotomy angle extension without excessive bleeding. Normal tubes and ovaries. Placenta to pathology.   Estimated Blood Loss: 600 cc   Total IV Fluids: 2600 ml LR  Urine Output: 100CC OF bloody urine  Specimens: Cord gas, cord blood and placenta   Complications: no complications  Disposition: PACU - hemodynamically stable.   Maternal Condition: stable  Baby condition / location:  NICU  Attending Attestation: I performed the procedure.   Signed: Surgeon(s): Elveria Royals,  MD

## 2014-04-16 NOTE — Progress Notes (Signed)
Patient ID: Melissa May, female   DOB: [birth year], 33 y.o.   MRN: [id number] Feels well in right exaggerated Sims, was able to sleep.   VS stable, BPs stable. BP 144/58  Pulse 88  Temp(Src) 99.8 F (37.7 C) (Oral)  Resp 15  Ht 5' 2.5" (1.588 m)  Wt 258 lb (117.028 kg)  BMI 46.41 kg/m2  SpO2 100%  LMP 07/17/2013  FHT:  150s/ minimal to moderate variability, + accels, decels resolved  UC:   Regular, pitocin turned off since 11 pm SVE:   Complete, LOT to OP position (since 1.30 am check) and no rotation or descent noted with rotation in exaggerated Sims.   Assessment / Plan: Stage II, 2 hrs, persistent LOT position station 0 to -1.   Fetal Wellbeing:  back in category I Pain Control:  Epidural  Anticipated MOD: Arrext of labor in stage II, proceed with Cesarean delivery. Patient agrees, understands cannot restart pitocin due to fetal intolerance to labor, no change in fetal position or station and is ready for C/section. .  Risks/complications of surgery reviewed incl infection, bleeding, damage to internal organs including bladder, bowels, ureters, blood vessels, other risks from anesthesia, VTE and delayed complications of any surgery, complications in future surgery reviewed. Also discussed neonatal complications incl difficult delivery, laceration, vacuum assistance, TTN etc. Pt understands and agrees, all concerns addressed.     Meaghann Choo R 04/16/2014, 3:22 AM

## 2014-04-16 NOTE — Progress Notes (Signed)
Ur chart review completed.  

## 2014-04-16 NOTE — Transfer of Care (Signed)
Immediate Anesthesia Transfer of Care Note  Patient: Melissa May  Procedure(s) Performed: Procedure(s): CESAREAN SECTION (N/A)  Patient Location: PACU  Anesthesia Type:Epidural  Level of Consciousness: awake, alert  and oriented  Airway & Oxygen Therapy: Patient Spontanous Breathing  Post-op Assessment: Report given to PACU RN  Post vital signs: Reviewed and stable  Complications: No apparent anesthesia complications

## 2014-04-16 NOTE — Lactation Note (Signed)
This note was copied from the chart of Melissa Milissa Fesperman. Lactation Consultation Note  Initial visit done.  Providing Breastmilk for your baby in NICU given and reviewed.  FOB supportive and assisting mom with pumping.  Reviewed pumping schedule.  Mom states she took breastfeeding class.  She reports milk leaking for 2 months.  Verbalizes she knows hand expression.  She has pumped twice and obtaining a few mls.  Mom attended healthy pregnancy class for [employer] employees and plans on getting DEBP prior to discharge.  Instructed to call with concerns or assist   Patient Name: Melissa May [facility] Date: 04/16/2014 Reason for consult: Initial assessment;NICU baby   Maternal Data    Feeding    LATCH Score/Interventions                      Lactation Tools Discussed/Used WIC Program: No Pump Review: Setup, frequency, and cleaning;Milk Storage Initiated by:: RN Date initiated:: 04/16/14   Consult Status Consult Status: Follow-up    Ave Filter 04/16/2014, 3:14 PM

## 2014-04-16 NOTE — Progress Notes (Signed)
Patient ID: Melissa May, female   DOB: May 07, 1981, 33 y.o.   MRN: [id number] C/o rectal pressure   VS stable, BPs stable.  FHT:  150s/ minimal to moderate variability, + accels, decels resolved  UC:   Regular, pitocin turned off since 11 pm SVE:   Complete, LOT to OP position. Station felt low initially at +1/+2 and patient with a lot of pelvic pressure but when attempted pushing, no movement noted past 0 and 15 min bradycardia in 110s noted. So stopped pushing.   Assessment / Plan: Stage II, LOT position. Exaggerated Sims tried and FHT recovered and maintaining at 150s baseline again. Keep in right Sims and reassess descent/   Fetal Wellbeing:  back in category I Pain Control:  Epidural  Anticipated MOD:  Guarded, pending fetal tolerance and descent. Pitocin remains off since not tolerated at restart   Shulamit Donofrio R 04/16/2014, 2:01 AM

## 2014-04-16 NOTE — Anesthesia Postprocedure Evaluation (Signed)
Anesthesia Post Note  Patient: Melissa May  Procedure(s) Performed: Procedure(s) (LRB): CESAREAN SECTION (N/A)  Anesthesia type: Epidural  Patient location: PACU  Post pain: Pain level controlled  Post assessment: Post-op Vital signs reviewed  Last Vitals:  Filed Vitals:   04/16/14 0505  BP: 131/58  Pulse: 94  Temp: 37.3 C  Resp:     Post vital signs: Reviewed  Level of consciousness: awake  Complications: No apparent anesthesia complications

## 2014-04-16 NOTE — Addendum Note (Signed)
Addendum created 04/16/14 1523 by Ignacia Bayley, CRNA   Modules edited: Notes Section   Notes Section:  File: [id number]

## 2014-04-17 ENCOUNTER — Encounter (HOSPITAL_COMMUNITY): Payer: Self-pay | Admitting: Obstetrics & Gynecology

## 2014-04-17 LAB — CBC
HEMATOCRIT: 27.9 % — AB (ref 36.0–46.0)
Hemoglobin: 9 g/dL — ABNORMAL LOW (ref 12.0–15.0)
MCH: 30.9 pg (ref 26.0–34.0)
MCHC: 32.3 g/dL (ref 30.0–36.0)
MCV: 95.9 fL (ref 78.0–100.0)
PLATELETS: 236 10*3/uL (ref 150–400)
RBC: 2.91 MIL/uL — ABNORMAL LOW (ref 3.87–5.11)
RDW: 15 % (ref 11.5–15.5)
WBC: 15.1 10*3/uL — ABNORMAL HIGH (ref 4.0–10.5)

## 2014-04-17 NOTE — Plan of Care (Signed)
Problem: Phase II Progression Outcomes Goal: Rh isoimmunization per orders Outcome: Not Applicable Date Met:  41/75/30 Baby O neg

## 2014-04-17 NOTE — Plan of Care (Signed)
Problem: Discharge Progression Outcomes Goal: MMR given as ordered Outcome: Not Met (add Reason) Pt refused MMR for Rubella Non-immune status at this time. Pt given MMR VIS.

## 2014-04-17 NOTE — Progress Notes (Signed)
POD # 1  Subjective: Pt reports feeling well/ Pain controlled with Motrin and Percocet Tolerating po/ Foley d/c'd and voiding without problems/ No n/v/ Flatus present Activity: ad lib Bleeding is light Newborn info:  Information for the patient's newborn:  Melissa May, Melissa May [[id number]]  female Feeding: breastpumping/NICU   Objective:  VS:  Filed Vitals:   04/16/14 1649 04/16/14 2205 04/17/14 0130 04/17/14 0548  BP: 107/52 122/63 101/53 105/62  Pulse: 81 97 93 74  Temp: 98.3 F (36.8 C) 98.6 F (37 C) 98.4 F (36.9 C) 98.3 F (36.8 C)  TempSrc: Oral Oral Oral Oral  Resp: 20 18 18 18   Height:      Weight:      SpO2: 100% 97% 98% 99%     I&O: Intake/Output     08/17 0701 - 08/18 0700 08/18 0701 - 08/19 0700   P.O. 1440    I.V. (mL/kg)     Total Intake(mL/kg) 1440 (12.3)    Urine (mL/kg/hr) 2550 (0.9) 300 (0.5)   Blood     Total Output 2550 300   Net -1110 -300           Recent Labs  04/15/14 1710 04/17/14 0553  WBC 19.0* 15.1*  HGB 12.0 9.0*  HCT 35.0* 27.9*  PLT 277 236    Blood type: --/--/O NEG (08/16 0750) Rubella: Nonimmune (01/16 0000)    Physical Exam:  General: alert and cooperative CV: Regular rate and rhythm Resp: CTA bilaterally Abdomen: soft, nontender, normal bowel sounds Incision: pressure dsg c/d/i Uterine Fundus: firm, below umbilicus, nontender Lochia: minimal Ext: edema 2+ BLE and Homans sign is negative, no sign of DVT    Assessment: POD # 1/ G1P1001/ S/P C/Section d/t arrest of descent ABL anemia Doing well  Plan: Ambulate Continue routine post op orders   Signed: Julianne Handler, N, MSN, CNM 04/17/2014, 11:47 AM

## 2014-04-18 MED ORDER — MEASLES, MUMPS & RUBELLA VAC ~~LOC~~ INJ
0.5000 mL | INJECTION | Freq: Once | SUBCUTANEOUS | Status: AC
  Administered 2014-04-19: 0.5 mL via SUBCUTANEOUS
  Filled 2014-04-18: qty 0.5

## 2014-04-18 NOTE — Progress Notes (Signed)
Patient ID: Melissa May, female   DOB: 1981-02-21, 33 y.o.   MRN: [id number] Subjective: POD# 2 Information for the patient's newborn:  Melissa, May [[id number]]  female  / circ planning before d/c from NICU  Reports feeling well Feeding: breast/pumping Patient reports tolerating PO.  Breast symptoms: none Pain controlled with ibuprofen (OTC) and narcotic analgesics including Percocet Denies HA/SOB/C/P/N/V/dizziness. Flatus (+). She reports vaginal bleeding as normal, without clots.  She is ambulating, urinating without difficult.     Objective:   VS:  Filed Vitals:   04/17/14 2150 04/18/14 0612 04/18/14 1200 04/18/14 1704  BP: 159/95 132/61 154/79 138/75  Pulse: 93 74 82 79  Temp: 98.3 F (36.8 C) 98.5 F (36.9 C)  98.2 F (36.8 C)  TempSrc: Oral Oral  Oral  Resp: _0 Height:      Weight:      SpO2: 100% 98% 100% 99%       Recent Labs  04/17/14 0553  WBC 15.1*  HGB 9.0*  HCT 27.9*  PLT 236     Blood type: O NEG (08/16 0750) / baby's blood type: O NEG  Rubella: Nonimmune (01/16 0000) / MMR ordered    Physical Exam:  General: alert, cooperative and no distress Abdomen: soft, nontender, normal bowel sounds Incision: clean, dry and intact / Tegaderm and Honeycomb dressing changed yesterday Uterine Fundus: firm, 2 FB below umbilicus, nontender Lochia: minimal Ext: edema 2+ and Homans sign is negative, no sign of DVT   Assessment/Plan: 33 y.o.   POD# 2.  s/p Cesarean Delivery.  Indications: Arrest of Decent in stage II of labor, Left occiput transverse to occiput posterior position, meconium in amniotic fluid                Principal Problem: Postpartum care following cesarean delivery (8/17)  Doing well, stable.               Regular diet as tolerated Ambulate Routine post-op care Anticipate D/C tomorrow  Graceann Congress, MSN, CNM 04/18/2014, 6:58 PM

## 2014-04-19 DIAGNOSIS — Z8659 Personal history of other mental and behavioral disorders: Secondary | ICD-10-CM

## 2014-04-19 MED ORDER — OXYCODONE-ACETAMINOPHEN 5-325 MG PO TABS: 1.0000 | ORAL_TABLET | ORAL | Status: DC | PRN

## 2014-04-19 MED ORDER — IBUPROFEN 600 MG PO TABS: 600.0000 mg | ORAL_TABLET | Freq: Four times a day (QID) | ORAL | Status: DC | PRN

## 2014-04-19 MED ORDER — MAGNESIUM OXIDE 400 (241.3 MG) MG PO TABS
200.0000 mg | ORAL_TABLET | Freq: Every day | ORAL | Status: DC
Start: 2014-04-19 — End: 2014-06-15

## 2014-04-19 MED ORDER — POLYSACCHARIDE IRON COMPLEX 150 MG PO CAPS
150.0000 mg | ORAL_CAPSULE | Freq: Two times a day (BID) | ORAL | Status: DC
  Filled 2014-04-19 (×3): qty 1

## 2014-04-19 MED ORDER — POLYSACCHARIDE IRON COMPLEX 150 MG PO CAPS: 150.0000 mg | ORAL_CAPSULE | Freq: Two times a day (BID) | ORAL | Status: DC

## 2014-04-19 MED ORDER — MAGNESIUM OXIDE 400 (241.3 MG) MG PO TABS
200.0000 mg | ORAL_TABLET | Freq: Every day | ORAL | Status: DC
  Filled 2014-04-19 (×2): qty 0.5

## 2014-04-19 NOTE — Discharge Summary (Signed)
POSTOPERATIVE DISCHARGE SUMMARY:  Patient ID: Melissa May MRN: [id number] DOB/AGE: 31-Mar-1981 31 y.o.  Admit date: 04/15/2014 Admission Diagnoses: 39.[redacted] weeks gestation, decreased fetal movement   Discharge date: 04/19/2014 Discharge Diagnoses: S/P Primary C/S on 04/16/2014, ABL anemia        Prenatal history: G1P1001   EDC : 04/17/2014, by Other Basis  Has received prenatal care at [facility] since [redacted] wks gestation. Primary provider : Dr. Benjie Karvonen Prenatal course complicated by obesity, gestational HTN, LGA, GBS, and h/o depression on Prozac.  Prenatal labs: ABO, Rh: --/--/O NEG (08/16 0750) / Rhophylac given 01/31/14 Antibody: NEG (08/16 0750) Rubella:   / non-immune RPR: NON REAC (08/16 0750)  HBsAg: Negative (01/16 0000)  HIV: Non-reactive (01/16 0000)  GBS: Positive (01/16 0000)  GTT: 123  Medical / Surgical History :  Past medical history:  Past Medical History  Diagnosis Date  . GERD (gastroesophageal reflux disease)   . Depression   . Seasonal allergies   . Asthma     prn inhaler  . TMJ syndrome   . 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 Obese     Past surgical history:  Past Surgical History  Procedure Laterality Date  . Upper gastrointestinal endoscopy  2009  . Wisdom tooth extraction    . Strabismus surgery      as an infant  . Cesarean section N/A 04/16/2014    Procedure: CESAREAN SECTION;  Surgeon: Elveria Royals, MD;  Location: [facility];  Service: Obstetrics;  Laterality: N/A;     Medications on Admission: Prescriptions prior to admission  Medication Sig Dispense Refill  . calcium carbonate (TUMS - DOSED IN MG ELEMENTAL CALCIUM) 500 MG chewable tablet Chew 1 tablet by mouth as needed for indigestion or heartburn.      . cetirizine (ZYRTEC) 10 MG tablet Take 10 mg by mouth at bedtime.       . famotidine (PEPCID) 10 MG tablet Take 10 mg by mouth at bedtime.      Marland Kitchen FLUoxetine (PROZAC) 40 MG capsule Take 40 mg by mouth daily.      .  Prenatal Vit-Fe Fumarate-FA (PRENATAL MULTIVITAMIN) TABS tablet Take 1 tablet by mouth at bedtime.         Allergies: Lexapro and Adhesive   Intrapartum Course:  Admited for IOL labor for LGA, AROM, Pitocin augmentation, epidural, arrest of second stage, persistent LOT, primary CS.  Postpartum Course: Complicated by ABL anemia.  Physical Exam:   VSS: Blood pressure 119/83, pulse 72, temperature 98.3 F (36.8 C), temperature source Oral, resp. rate 18, height 5' 2.5" (1.588 m), weight 117.028 kg (258 lb), last menstrual period 07/17/2013, SpO2 100.00%, unknown if currently breastfeeding.  LABS:  Recent Labs  04/17/14 0553  WBC 15.1*  HGB 9.0*  PLT 236    General: Alert and oriented x3 Heart: RRR Lungs: CTA bilaterally GI: soft, non-tender, non-distended, BS x4 Lochia: small Uterus: firm below umbilicus Incision: well approximated, honeycomb dressing, no significant erythema, drainage, or edema Extremities: trace edema BLE, Homans neg   Newborn Data Live born female  Birth Weight: 7 lb 4.8 oz (3310 g) APGAR: 5, 7  See operative report for further details  NICU admit.  Discharge Instructions:  Wound Care: keep clean and dry / remove honeycomb POD 5-6 Postpartum Instructions: [facility] discharge booklet - instructions reviewed Medications:    Medication List    STOP taking these medications       calcium carbonate 500 MG chewable  tablet  Commonly known as:  TUMS - dosed in mg elemental calcium      TAKE these medications       cetirizine 10 MG tablet  Commonly known as:  ZYRTEC  Take 10 mg by mouth at bedtime.     famotidine 10 MG tablet  Commonly known as:  PEPCID  Take 10 mg by mouth at bedtime.     FLUoxetine 40 MG capsule  Commonly known as:  PROZAC  Take 40 mg by mouth daily.     ibuprofen 600 MG tablet  Commonly known as:  ADVIL,MOTRIN  Take 1 tablet (600 mg total) by mouth every 6 (six) hours as needed for mild pain.     iron polysaccharides  150 MG capsule  Commonly known as:  NIFEREX  Take 1 capsule (150 mg total) by mouth 2 (two) times daily.     magnesium oxide 400 (241.3 MG) MG tablet  Commonly known as:  MAG-OX  Take 0.5 tablets (200 mg total) by mouth daily.     oxyCODONE-acetaminophen 5-325 MG per tablet  Commonly known as:  PERCOCET/ROXICET  Take 1-2 tablets by mouth every 4 (four) hours as needed for severe pain (moderate - severe pain).     prenatal multivitamin Tabs tablet  Take 1 tablet by mouth at bedtime.            Follow-up Information   Follow up with MODY,VAISHALI R, MD. Schedule an appointment as soon as possible for a visit in 6 weeks. (and 2 weeks to check mood/Prozac)    Specialty:  Obstetrics and Gynecology   Contact information:   [place] [address] [phone]         Signed: Julianne Handler, Delane Ginger MSN, CNM 04/19/2014, 1:00 PM

## 2014-04-19 NOTE — Progress Notes (Signed)
Discharge teaching complete. Pt understood all instructions and did not have any questions. Pt ambulated out of hospital and discharge home to family.

## 2014-04-19 NOTE — Lactation Note (Signed)
This note was copied from the chart of Melissa May. Lactation Consultation Note     Follow up consult with this mom of a NICU baby, full term, now 15 hours old. Mom is going home, and discharge pumping teaching done with mom and dad. Mom is expressing 60 mls 6 times a day. i explained to her she should be pumping at least 8 times a day, up to 12. Mom has not been able to latch baby in NICU. Mom has flatish nipples, and baby is being formula and bottle fed, so I told mom we would need to add a nipple shiled. I also encouraged her to make an o/p lactation appointment at discharge of baby.  The baby should go home in 4-5 days.   Patient Name: Melissa Sahvannah Rieser OMVEH'M Date: 04/19/2014 Reason for consult: Follow-up assessment;NICU baby   Maternal Data    Feeding Feeding Type: Breast Milk Nipple Type: Slow - flow Length of feed: 3 min  LATCH Score/Interventions                      Lactation Tools Discussed/Used Tools: Flanges;Comfort gels Flange Size: Other (comment) (I gave mom 21's to try and see if this helps with ther nipple and areola tenderness) Pump Review: Setup, frequency, and cleaning (mom has been pumping about 8 times a day. i encouraged her to increase to to at least 8 times a day, and to add hadn expression to each pumping. Mom has a [employer] - [employer] employee. Mom and baby will be followed in NICU. I enoucraged mom to make a lactation o/)   Consult Status Consult Status: PRN Follow-up type: In-patient (NICU)    Tonna Corner 04/19/2014, 1:50 PM

## 2014-04-19 NOTE — Progress Notes (Addendum)
POD # 3  Subjective: Pt reports feeling well/ Pain controlled with Motrin and Percocet Tolerating po/Voiding without problems/ No n/v/ Flatus present, +BM Activity: ad lib Bleeding is light Newborn info:  Information for the patient's newborn:  Tatumn, Corbridge [[id number]]  female  / Circumcision: planning/ Feeding: breastpumping   Objective: VS: VS:  Filed Vitals:   04/18/14 1200 04/18/14 1704 04/18/14 2151 04/19/14 0619  BP: 154/79 138/75 147/86 119/83  Pulse: 82 79 87 72  Temp:  98.2 F (36.8 C) 98.9 F (37.2 C) 98.3 F (36.8 C)  TempSrc:  Oral Oral Oral  Resp: 20 20 20 18   Height:      Weight:      SpO2: 100% 99% 100% 100%    I&O: Intake/Output     08/19 0701 - 08/20 0700 08/20 0701 - 08/21 0700   Urine (mL/kg/hr)     Total Output       Net              LABS:  Recent Labs  04/17/14 0553  WBC 15.1*  HGB 9.0*  PLT 236                           Physical Exam:  General: alert and cooperative CV: Regular rate and rhythm Resp: CTA bilaterally Abdomen: soft, nontender, normal bowel sounds Incision: healing well, no drainage, no erythema, no hernia, no seroma, no swelling, well approximated, honeycomb dsg c/d/i Uterine Fundus: firm, below umbilicus, nontender Lochia: minimal Ext: edema trace BLE and Homans sign is negative, no sign of DVT    Assessment: POD # 3/ G1P1001/ S/P C/Section d/t arrest of descent ABL anemia Rubella non-immune Doing well and stable for discharge home  Plan: Discharge home RX's: Ibuprofen 600mg  po Q 6 hrs prn pain #30 Refill x 1 Percocet 5/325 1 - 2 tabs po every 4 hrs prn pain #30 Refill x 0 Niferex 150mg  po BID #60 Refill x 1 Magnesium Oxide 200 mg po daily #30 refill x1 [facility] booklet given    Signed: Julianne Handler, Delane Ginger, MSN, CNM 04/19/2014, 12:22 PM

## 2014-05-31 DIAGNOSIS — H50811 Duane's syndrome, right eye: Secondary | ICD-10-CM

## 2014-05-31 HISTORY — DX: Duane's syndrome, right eye: H50.811

## 2014-06-15 ENCOUNTER — Encounter (HOSPITAL_BASED_OUTPATIENT_CLINIC_OR_DEPARTMENT_OTHER): Payer: Self-pay | Admitting: *Deleted

## 2014-06-15 NOTE — Pre-Procedure Instructions (Signed)
Pt. advised to pump and discard breast milk for 24 hours post-op; pt. voiced understanding.

## 2014-06-19 ENCOUNTER — Ambulatory Visit: Payer: Self-pay | Admitting: Ophthalmology

## 2014-06-19 NOTE — H&P (Signed)
  Date of examination:  06-18-14  Indication for surgery: to straighten the eyes and improve left face turn  Pertinent past medical history:  Past Medical History  Diagnosis Date  . GERD (gastroesophageal reflux disease)   . Depression   . Seasonal allergies   . Asthma     prn inhaler  . TMJ syndrome   . 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 Obese   . Carpal tunnel syndrome on both sides     onset during pregnancy    Pertinent ocular history:  Eye muscle surgery OD 1984 for Duane's syndrome  Pertinent family history:  Family History  Problem Relation Age of Onset  . Cancer Father   . Kidney cancer Father   . Hypertension Father   . Heart disease Maternal Grandfather   . Rheum arthritis Mother   . Asthma Paternal Grandmother   . Diabetes Paternal Grandmother   . Hypertension Paternal Grandmother   . Allergies Brother   . Rheum arthritis Maternal Grandmother   . Stroke Maternal Grandmother     General:  Healthy appearing patient in no distress.    Eyes:    Acuitycc OD 20/20  OS 20/20  External: Within normal limits     Anterior segment: Within normal limits   X nasal conj scar OD  Motility:   XT=15, 6 in R gaze, 40 in L gaze, 1-2- RLR and RMR.  upshoot OD in adduction  Fundus: deferred  Refraction:  Manifest myopia/astigmatism OU   Heart: Regular rate and rhythm without murmur     Lungs: Clear to auscultation     Abdomen: Soft, nontender, normal bowel sounds     Impression:Exotropia, incomitant, consistent with overcorrection s/p RMR recess for Duane's OD (records unavailable), with L face turn (despite the fact that she prefers OS)  Plan: RMR advance, ?adjustable, to correct exotropia and face turn  Derry Skill

## 2014-06-22 ENCOUNTER — Ambulatory Visit (HOSPITAL_BASED_OUTPATIENT_CLINIC_OR_DEPARTMENT_OTHER): Payer: 59 | Admitting: Anesthesiology

## 2014-06-22 ENCOUNTER — Encounter (HOSPITAL_BASED_OUTPATIENT_CLINIC_OR_DEPARTMENT_OTHER): Payer: Self-pay | Admitting: *Deleted

## 2014-06-22 ENCOUNTER — Encounter (HOSPITAL_BASED_OUTPATIENT_CLINIC_OR_DEPARTMENT_OTHER): Payer: 59 | Admitting: Anesthesiology

## 2014-06-22 ENCOUNTER — Ambulatory Visit (HOSPITAL_BASED_OUTPATIENT_CLINIC_OR_DEPARTMENT_OTHER)
Admission: RE | Admit: 2014-06-22 | Discharge: 2014-06-22 | Disposition: A | Discharge status: Home / Self Care | Payer: 59 | Source: Ambulatory Visit | Attending: Ophthalmology | Admitting: Ophthalmology

## 2014-06-22 ENCOUNTER — Encounter (HOSPITAL_BASED_OUTPATIENT_CLINIC_OR_DEPARTMENT_OTHER)
Admission: RE | Disposition: A | Discharge status: Home / Self Care | Payer: Self-pay | Source: Ambulatory Visit | Attending: Ophthalmology

## 2014-06-22 DIAGNOSIS — Z6841 Body Mass Index (BMI) 40.0 and over, adult: Secondary | ICD-10-CM | POA: Insufficient documentation

## 2014-06-22 DIAGNOSIS — J45909 Unspecified asthma, uncomplicated: Secondary | ICD-10-CM | POA: Insufficient documentation

## 2014-06-22 DIAGNOSIS — E669 Obesity, unspecified: Secondary | ICD-10-CM | POA: Insufficient documentation

## 2014-06-22 DIAGNOSIS — K219 Gastro-esophageal reflux disease without esophagitis: Secondary | ICD-10-CM | POA: Diagnosis not present

## 2014-06-22 DIAGNOSIS — H50811 Duane's syndrome, right eye: Secondary | ICD-10-CM | POA: Insufficient documentation

## 2014-06-22 DIAGNOSIS — F329 Major depressive disorder, single episode, unspecified: Secondary | ICD-10-CM | POA: Insufficient documentation

## 2014-06-22 DIAGNOSIS — H5010 Unspecified exotropia: Secondary | ICD-10-CM | POA: Insufficient documentation

## 2014-06-22 DIAGNOSIS — G5601 Carpal tunnel syndrome, right upper limb: Secondary | ICD-10-CM | POA: Insufficient documentation

## 2014-06-22 DIAGNOSIS — G5602 Carpal tunnel syndrome, left upper limb: Secondary | ICD-10-CM | POA: Insufficient documentation

## 2014-06-22 HISTORY — PX: STRABISMUS SURGERY: SHX218

## 2014-06-22 LAB — POCT HEMOGLOBIN-HEMACUE: HEMOGLOBIN: 13.6 g/dL (ref 12.0–15.0)

## 2014-06-22 SURGERY — REPAIR STRABISMUS
Anesthesia: General | Site: Eye | Laterality: Right

## 2014-06-22 MED ORDER — OXYCODONE HCL 5 MG/5ML PO SOLN: 5.0000 mg | Freq: Once | ORAL | Status: DC | PRN

## 2014-06-22 MED ORDER — OXYCODONE HCL 5 MG PO TABS: 5.0000 mg | ORAL_TABLET | Freq: Once | ORAL | Status: DC | PRN

## 2014-06-22 MED ORDER — MIDAZOLAM HCL 2 MG/2ML IJ SOLN: 1.0000 mg | INTRAMUSCULAR | Status: DC | PRN

## 2014-06-22 MED ORDER — ONDANSETRON HCL 4 MG/2ML IJ SOLN
INTRAMUSCULAR | Status: DC | PRN
  Administered 2014-06-22: 4 mg via INTRAVENOUS

## 2014-06-22 MED ORDER — TOBRAMYCIN-DEXAMETHASONE 0.3-0.1 % OP OINT
TOPICAL_OINTMENT | OPHTHALMIC | Status: DC | PRN
  Administered 2014-06-22: 1 via OPHTHALMIC

## 2014-06-22 MED ORDER — MEPERIDINE HCL 25 MG/ML IJ SOLN: 6.2500 mg | INTRAMUSCULAR | Status: DC | PRN

## 2014-06-22 MED ORDER — MIDAZOLAM HCL 2 MG/ML PO SYRP: 12.0000 mg | ORAL_SOLUTION | Freq: Once | ORAL | Status: DC | PRN

## 2014-06-22 MED ORDER — PROPOFOL 10 MG/ML IV BOLUS
INTRAVENOUS | Status: DC | PRN
  Administered 2014-06-22: 150 mg via INTRAVENOUS

## 2014-06-22 MED ORDER — ONDANSETRON HCL 4 MG/2ML IJ SOLN: 4.0000 mg | Freq: Once | INTRAMUSCULAR | Status: DC | PRN

## 2014-06-22 MED ORDER — MIDAZOLAM HCL 2 MG/2ML IJ SOLN
INTRAMUSCULAR | Status: AC
  Filled 2014-06-22: qty 2

## 2014-06-22 MED ORDER — FENTANYL CITRATE 0.05 MG/ML IJ SOLN: 50.0000 ug | INTRAMUSCULAR | Status: DC | PRN

## 2014-06-22 MED ORDER — TOBRAMYCIN-DEXAMETHASONE 0.3-0.1 % OP OINT
TOPICAL_OINTMENT | OPHTHALMIC | Status: AC
  Filled 2014-06-22: qty 7

## 2014-06-22 MED ORDER — FENTANYL CITRATE 0.05 MG/ML IJ SOLN
INTRAMUSCULAR | Status: DC | PRN
  Administered 2014-06-22: 100 ug via INTRAVENOUS

## 2014-06-22 MED ORDER — HYDROMORPHONE HCL 1 MG/ML IJ SOLN
INTRAMUSCULAR | Status: AC
  Filled 2014-06-22: qty 1

## 2014-06-22 MED ORDER — KETOROLAC TROMETHAMINE 30 MG/ML IJ SOLN
INTRAMUSCULAR | Status: DC | PRN
  Administered 2014-06-22: 30 mg via INTRAVENOUS

## 2014-06-22 MED ORDER — ATROPINE SULFATE 0.4 MG/ML IJ SOLN
INTRAMUSCULAR | Status: DC | PRN
  Administered 2014-06-22: .2 mg via INTRAVENOUS

## 2014-06-22 MED ORDER — BSS IO SOLN
INTRAOCULAR | Status: AC
  Filled 2014-06-22: qty 30

## 2014-06-22 MED ORDER — TOBRAMYCIN-DEXAMETHASONE 0.3-0.1 % OP SUSP
OPHTHALMIC | Status: AC
  Filled 2014-06-22: qty 5

## 2014-06-22 MED ORDER — MIDAZOLAM HCL 5 MG/5ML IJ SOLN
INTRAMUSCULAR | Status: DC | PRN
  Administered 2014-06-22: 2 mg via INTRAVENOUS

## 2014-06-22 MED ORDER — LIDOCAINE HCL (CARDIAC) 20 MG/ML IV SOLN
INTRAVENOUS | Status: DC | PRN
  Administered 2014-06-22: 80 mg via INTRAVENOUS

## 2014-06-22 MED ORDER — DEXAMETHASONE SODIUM PHOSPHATE 4 MG/ML IJ SOLN
INTRAMUSCULAR | Status: DC | PRN
  Administered 2014-06-22: 10 mg via INTRAVENOUS

## 2014-06-22 MED ORDER — FENTANYL CITRATE 0.05 MG/ML IJ SOLN
INTRAMUSCULAR | Status: AC
  Filled 2014-06-22: qty 6

## 2014-06-22 MED ORDER — HYDROMORPHONE HCL 1 MG/ML IJ SOLN
0.2500 mg | INTRAMUSCULAR | Status: DC | PRN
  Administered 2014-06-22: 0.5 mg via INTRAVENOUS
  Administered 2014-06-22: 0.25 mg via INTRAVENOUS

## 2014-06-22 MED ORDER — LACTATED RINGERS IV SOLN
INTRAVENOUS | Status: DC
  Administered 2014-06-22: 10:00:00 via INTRAVENOUS

## 2014-06-22 SURGICAL SUPPLY — 32 items
APL SRG 3 HI ABS STRL LF PLS (MISCELLANEOUS) ×1
APPLICATOR COTTON TIP 6IN STRL (MISCELLANEOUS) ×12 IMPLANT
APPLICATOR DR MATTHEWS STRL (MISCELLANEOUS) ×3 IMPLANT
BANDAGE EYE OVAL (MISCELLANEOUS) IMPLANT
CAUTERY EYE LOW TEMP 1300F FIN (OPHTHALMIC RELATED) IMPLANT
CLOSURE WOUND 1/4X4 (GAUZE/BANDAGES/DRESSINGS)
COVER BACK TABLE 60X90IN (DRAPES) ×3 IMPLANT
COVER MAYO STAND STRL (DRAPES) ×3 IMPLANT
DRAPE SURG 17X23 STRL (DRAPES) ×6 IMPLANT
DRAPE U-SHAPE 76X120 STRL (DRAPES) IMPLANT
GLOVE BIO SURGEON STRL SZ 6.5 (GLOVE) ×2 IMPLANT
GLOVE BIO SURGEONS STRL SZ 6.5 (GLOVE) ×1
GLOVE BIOGEL M STRL SZ7.5 (GLOVE) ×6 IMPLANT
GOWN STRL REUS W/ TWL LRG LVL3 (GOWN DISPOSABLE) ×1 IMPLANT
GOWN STRL REUS W/TWL LRG LVL3 (GOWN DISPOSABLE) ×3
GOWN STRL REUS W/TWL XL LVL3 (GOWN DISPOSABLE) ×3 IMPLANT
NS IRRIG 1000ML POUR BTL (IV SOLUTION) ×3 IMPLANT
PACK BASIN DAY SURGERY FS (CUSTOM PROCEDURE TRAY) ×3 IMPLANT
SHEET MEDIUM DRAPE 40X70 STRL (DRAPES) IMPLANT
SLEEVE SCD COMPRESS KNEE MED (MISCELLANEOUS) ×3 IMPLANT
SPEAR EYE SURG WECK-CEL (MISCELLANEOUS) ×6 IMPLANT
STRIP CLOSURE SKIN 1/4X4 (GAUZE/BANDAGES/DRESSINGS) IMPLANT
SUT 6 0 SILK T G140 8DA (SUTURE) IMPLANT
SUT MERSILENE 6 0 S14 DA (SUTURE) ×3 IMPLANT
SUT PLAIN 6 0 TG1408 (SUTURE) ×2 IMPLANT
SUT SILK 4 0 C 3 735G (SUTURE) IMPLANT
SUT VICRYL 6 0 S 28 (SUTURE) IMPLANT
SUT VICRYL ABS 6-0 S29 18IN (SUTURE) IMPLANT
SYR TB 1ML LL NO SAFETY (SYRINGE) ×3 IMPLANT
SYRINGE 10CC LL (SYRINGE) ×3 IMPLANT
TOWEL OR 17X24 6PK STRL BLUE (TOWEL DISPOSABLE) ×3 IMPLANT
TRAY DSU PREP LF (CUSTOM PROCEDURE TRAY) ×3 IMPLANT

## 2014-06-22 NOTE — Op Note (Signed)
06/22/2014  11:21 AM  PATIENT:  Melissa May    PRE-OPERATIVE DIAGNOSIS:  1. Exotropia, consecutive, s/p RMR recession      2.  Duane's syndrome, type 1 OD  POST-OPERATIVE DIAGNOSIS:  same  PROCEDURE:  Right medial rectus muscle advancement 3.0 mm/resection 2.5 mm  SURGEON:  Derry Skill, MD  ANESTHESIA:   General  COMPLICATIONS: none  OPERATIVE PROCEDURE: After routine preoperative evaluation including informed consent, the patient was taken to the operating room where she was identified by me. General anesthesia was induced without difficulty after placement of appropriate monitors. The patient was prepped and draped in standard sterile fashion. A lid speculum was placed in the right eye.  Through an inferonasal fornix incision through conjunctiva and Tenon fascia, the right medial rectus muscle was engaged on a series of muscle hooks and cleared of its fascial attachments and scar tissue from the previous recession. The muscle was found inserted 8 mm posterior to the limbus. The tendon was secured with a double-armed 6-0 Vicryl suture, with a double locking bite at each border of the muscle, approximately to have millimeters posterior to the current insertion. The muscle was disinserted. It was reattached to sclera 5.0 mm posterior to the limbus, using direct scleral passes in crossed swords fashion. The suture ends were tied securely. Conjunctiva was closed with a single 60 plain gut suture. TobraDex ophthalmic ointment was placed in the right eye. The patient was awakened without difficulty and taken to the recovery room in stable condition having suffered no intraoperative or immediate postoperative consultations  Derry Skill, MD

## 2014-06-22 NOTE — H&P (View-Only) (Signed)
  Date of examination:  06-18-14  Indication for surgery: to straighten the eyes and improve left face turn  Pertinent past medical history:  Past Medical History  Diagnosis Date  . GERD (gastroesophageal reflux disease)   . Depression   . Seasonal allergies   . Asthma     prn inhaler  . TMJ syndrome   . 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 Obese   . Carpal tunnel syndrome on both sides     onset during pregnancy    Pertinent ocular history:  Eye muscle surgery OD 1984 for Duane's syndrome  Pertinent family history:  Family History  Problem Relation Age of Onset  . Cancer Father   . Kidney cancer Father   . Hypertension Father   . Heart disease Maternal Grandfather   . Rheum arthritis Mother   . Asthma Paternal Grandmother   . Diabetes Paternal Grandmother   . Hypertension Paternal Grandmother   . Allergies Brother   . Rheum arthritis Maternal Grandmother   . Stroke Maternal Grandmother     General:  Healthy appearing patient in no distress.    Eyes:    Acuitycc OD 20/20  OS 20/20  External: Within normal limits     Anterior segment: Within normal limits   X nasal conj scar OD  Motility:   XT=15, 6 in R gaze, 40 in L gaze, 1-2- RLR and RMR.  upshoot OD in adduction  Fundus: deferred  Refraction:  Manifest myopia/astigmatism OU   Heart: Regular rate and rhythm without murmur     Lungs: Clear to auscultation     Abdomen: Soft, nontender, normal bowel sounds     Impression:Exotropia, incomitant, consistent with overcorrection s/p RMR recess for Duane's OD (records unavailable), with L face turn (despite the fact that she prefers OS)  Plan: RMR advance, ?adjustable, to correct exotropia and face turn  Derry Skill

## 2014-06-22 NOTE — Anesthesia Procedure Notes (Signed)
Procedure Name: LMA Insertion Performed by: Terrance Mass Pre-anesthesia Checklist: Patient identified, Timeout performed, Emergency Drugs available, Suction available and Patient being monitored Patient Re-evaluated:Patient Re-evaluated prior to inductionOxygen Delivery Method: Circle system utilized Preoxygenation: Pre-oxygenation with 100% oxygen Intubation Type: IV induction Ventilation: Mask ventilation without difficulty LMA: LMA flexible inserted LMA Size: 4.0 Tube type: Oral Number of attempts: 1 Placement Confirmation: breath sounds checked- equal and bilateral and positive ETCO2 Tube secured with: Tape Dental Injury: Teeth and Oropharynx as per pre-operative assessment

## 2014-06-22 NOTE — Anesthesia Preprocedure Evaluation (Signed)

## 2014-06-22 NOTE — Interval H&P Note (Signed)
History and Physical Interval Note:  06/22/2014 10:19 AM  Melissa May  has presented today for surgery, with the diagnosis of DUANES SYNDROME RIGHT EYE  The various methods of treatment have been discussed with the patient and family. After consideration of risks, benefits and other options for treatment, the patient has consented to  Procedure(s): REPAIR STRABISMUS RIGHT EYE (Right) as a surgical intervention .  The patient's history has been reviewed, patient examined, no change in status, stable for surgery.  I have reviewed the patient's chart and labs.  Questions were answered to the patient's satisfaction.     Derry Skill

## 2014-06-22 NOTE — Anesthesia Postprocedure Evaluation (Signed)
Anesthesia Post Note  Patient: Melissa May  Procedure(s) Performed: Procedure(s) (LRB): REPAIR STRABISMUS RIGHT EYE (Right)  Anesthesia type: general  Patient location: PACU  Post pain: Pain level controlled  Post assessment: Patient's Cardiovascular Status Stable  Last Vitals:  Filed Vitals:   06/22/14 1311  BP: 124/43  Pulse: 53  Temp: 36.8 C  Resp: 18    Post vital signs: Reviewed and stable  Level of consciousness: sedated  Complications: No apparent anesthesia complications

## 2014-06-22 NOTE — Transfer of Care (Signed)
Immediate Anesthesia Transfer of Care Note  Patient: Melissa May  Procedure(s) Performed: Procedure(s): REPAIR STRABISMUS RIGHT EYE (Right)  Patient Location: PACU  Anesthesia Type:General  Level of Consciousness: awake and alert   Airway & Oxygen Therapy: Patient Spontanous Breathing and Patient connected to face mask oxygen  Post-op Assessment: Report given to PACU RN and Post -op Vital signs reviewed and stable  Post vital signs: Reviewed and stable  Complications: No apparent anesthesia complications

## 2014-06-22 NOTE — Discharge Instructions (Signed)
Diet: Clear liquids, advance to soft foods then regular diet as tolerated.  Pain control:  Ibuprofen 600 mg by mouth every 6-8 hours as needed for pain.  Cool compress/ice pack today. I did not prescribe a narcotic pain medication because of the reported morphine allergy  Eye medications:  none  Activity: No swimming for 1 week.  It is OK to let water run over the face and eyes while showering or taking a bath, even during the first week.  No other restriction on exercise or activity.  Call Dr. Janee Morn office [phone] with any problems or concerns.   Post Anesthesia Home Care Instructions  Activity: Get plenty of rest for the remainder of the day. A responsible adult should stay with you for 24 hours following the procedure.  For the next 24 hours, DO NOT: -Drive a car -Paediatric nurse -Drink alcoholic beverages -Take any medication unless instructed by your physician -Make any legal decisions or sign important papers.  Meals: Start with liquid foods such as gelatin or soup. Progress to regular foods as tolerated. Avoid greasy, spicy, heavy foods. If nausea and/or vomiting occur, drink only clear liquids until the nausea and/or vomiting subsides. Call your physician if vomiting continues.  Special Instructions/Symptoms: Your throat may feel dry or sore from the anesthesia or the breathing tube placed in your throat during surgery. If this causes discomfort, gargle with warm salt water. The discomfort should disappear within 24 hours.

## 2014-06-26 ENCOUNTER — Encounter (HOSPITAL_BASED_OUTPATIENT_CLINIC_OR_DEPARTMENT_OTHER): Payer: Self-pay | Admitting: Ophthalmology

## 2014-07-02 ENCOUNTER — Encounter (HOSPITAL_BASED_OUTPATIENT_CLINIC_OR_DEPARTMENT_OTHER): Payer: Self-pay | Admitting: Ophthalmology

## 2014-08-01 ENCOUNTER — Telehealth: Payer: Self-pay | Admitting: Gynecology

## 2014-08-01 NOTE — Telephone Encounter (Signed)
Patient calling requesting to get a Mirena. She says she just had a baby. Current insurance information is on file. Please advise?

## 2014-08-01 NOTE — Telephone Encounter (Signed)
You can put her at 8:15am on a day of clinic.  If you'll just let me know, I can make sure Claiborne Billings is aware too.  Thanks.

## 2014-08-01 NOTE — Telephone Encounter (Signed)
Spoke with patient. Patient would like to get Mirena IUD. Patient has not had Mirena in the past. Patient recently had a baby and is not currently on any form of birth control. Advised patient will need to be seen for consult with MD to discuss IUD. Patient is agreeable. Offered patient appointment on 12/10 at 12:45pm with Dr.Miller but patient declines. Would like early morning appointment. Offered first early morning appointment with Dr.Miller on 1/15 at 8:30am but patient declines. Would like to be seen the week of December 14th. Offered patient appointment on 12/15 at 2:45pm but patient declines. Offered 12/17 at 12:45pm with Dr.Miller but patient declines. "I am going to have to think about both of those dates and call back to schedule."  Routing to provider for final review. Patient agreeable to disposition. Will close encounter

## 2014-08-03 NOTE — Telephone Encounter (Signed)
Left message to call Deeanna Beightol at [phone]. 

## 2014-08-06 NOTE — Telephone Encounter (Signed)
Left message to call Natsumi Whitsitt at [phone]. 

## 2014-08-08 NOTE — Telephone Encounter (Signed)
Left message to call Melissa May at [phone]. 

## 2014-08-14 NOTE — Telephone Encounter (Signed)
Dr.Miller I have attempted to reach patient x3 with no return call. Would you like me to send patient a letter in regards to scheduling appointment?

## 2014-08-15 NOTE — Telephone Encounter (Signed)
No.  I don't think that is necessary.  OK to close encounter.

## 2014-08-15 NOTE — Telephone Encounter (Signed)
Spoke with patient. Advised of message as seen below from Dixie. Patient is agreeable. Patient requesting appointment for this Friday 12/18 with Dr.Miller. As this is the week she is available to come in for appointment. Appointment scheduled for 12/18 at 8:15am with Dr.Miller. Patient is agreeable to date and time.  Cc: Maryann Conners, CMA  Routing to provider for final review. Patient agreeable to disposition. Will close encounter

## 2014-08-17 ENCOUNTER — Ambulatory Visit (INDEPENDENT_AMBULATORY_CARE_PROVIDER_SITE_OTHER): Payer: 59 | Admitting: Obstetrics & Gynecology

## 2014-08-17 VITALS — BP 106/62 | HR 60 | Resp 12 | Wt 232.2 lb

## 2014-08-17 DIAGNOSIS — Z3009 Encounter for other general counseling and advice on contraception: Secondary | ICD-10-CM

## 2014-08-17 NOTE — Progress Notes (Signed)
Patient ID: Melissa May, female   DOB: [birth year], 33 y.o.   MRN: [id number]  33 yo G1P1 MWF here for discussion of contraception.  Pt had cesarean section 04/16/14.  Is breastfeeding/pumping and has not resumed menstrual cycles at this time.  Is sexually active.  Not interested in child bearing again for at least a few more years, maybe not at all.  Feels progesterone IUD is a good option for her.  Did end up with fertility consultation due to difficulty with conceiving.  Was able to conceive on own after having HSG.    Options for contraception in additional to withdrawal and barrier method discussed.  Pt definitely not interested in anything permanent right now.   Combination OCPs are not best option for pt right now due to breast feeding.  Pt would like to try and continue for a year if possible.  Implanon/Nexplanon, Depo Provera, Skyla IUD, ParaGard IUD, and Mirena IUD all discussed.  IUD placement, risks and benefits all discussed.  Pt would like to proceed with Mirena IUD placement.  Pt has limited days off due to just returning to work.  Would like to proceed on January 8th if possible.  Pt knows needs to abstain for two weeks before procedure and pregnancy test will be done same day.  Understands very clearly if does not abstain, could risk interruption of early pregnancy.  Knows this means she needs to abstain after 08/24/14.    Assessment:  Desired contraception with IUD  Plan:  Return for visit 09/07/14.  Will abstain for two weeks.  Will have UPT done same day before procedure.  All questions answered.    ~20 minutes spent with patient >50% of time was in face to face discussion of above.

## 2014-08-19 ENCOUNTER — Encounter: Payer: Self-pay | Admitting: Obstetrics & Gynecology

## 2014-08-20 ENCOUNTER — Telehealth: Payer: Self-pay | Admitting: Obstetrics & Gynecology

## 2014-08-20 NOTE — Telephone Encounter (Signed)
Patient is scheduled @ 930 on 09-07-13

## 2014-08-20 NOTE — Telephone Encounter (Signed)
Left message for patient to call back. Need to advise of benefits and appointment for IUD placement.

## 2014-08-21 NOTE — Telephone Encounter (Signed)
Routing to provider for final review. Patient agreeable to disposition. Will close encounter.     

## 2014-08-30 ENCOUNTER — Telehealth: Payer: Self-pay | Admitting: Obstetrics & Gynecology

## 2014-08-30 NOTE — Telephone Encounter (Signed)
Spoke with patient. Advised that per benefits quote received, IUD and insertion is covered at 100%. There will be 0 patient liability. Patient is to call within the first 5 days of her cycle to schedule insertion. Patient agreeable. I also confirmed patient knowledge of appt 01.08.2016 @ 0930. Patient aware and agreeable.

## 2014-09-07 ENCOUNTER — Ambulatory Visit (INDEPENDENT_AMBULATORY_CARE_PROVIDER_SITE_OTHER): Payer: 59 | Admitting: Obstetrics & Gynecology

## 2014-09-07 VITALS — BP 118/86 | HR 68 | Resp 16 | Wt 234.6 lb

## 2014-09-07 DIAGNOSIS — Z30430 Encounter for insertion of intrauterine contraceptive device: Secondary | ICD-10-CM

## 2014-09-07 DIAGNOSIS — Z302 Encounter for sterilization: Secondary | ICD-10-CM

## 2014-09-07 DIAGNOSIS — N912 Amenorrhea, unspecified: Secondary | ICD-10-CM

## 2014-09-07 LAB — POCT URINE PREGNANCY: Preg Test, Ur: NEGATIVE

## 2014-09-07 NOTE — Progress Notes (Signed)
33 yrs Married Caucasianfemale presents for insertion of Mirena. Denies any vaginal symptoms or STD concerns.  LMP:  Amenorrhea due to breast feeding.  Unsure LMP but last intercourse was over a month ago.    Patient read information regarding IUD insertion.  All questions addressed.    Healthy female:  WNWD, WF, NAD Pelvic exam: Vulva: normal female genitalia Vagina:normal vagina, no discharge, exudate, lesion, or erythema Cervix: Non-tender, Negative CMT, no lesions or redness, nulliparous/parous os Uterus:normal shape, position and consistency   Procedure:  Speculum inserted into vagina. Cervix visualized and cleansed with betadine solution X 3. Tenaculum placed on cervix at 12 o'clock position(s).  Uterus sounded to 7 centimeters.  IUD removed from sterile packet and under sterile conditions inserted to fundus of uterus.  Introducer removed without difficulty.  IUD string trimmed to 2 centimeters.  Remainder string given to patient to feel for identification.  Tenaculum removed.  minimal bleeding noted.  Speculum removed.  Uterus palpated normal.  Patient tolerated procedure well.  A: Insertion of Mirena, Lot # [id number], Expiration date 4/18   P:  Instructions and warnings signs given.       IUD identification card given with IUD removal 09/08/2019       Return visit 6 weeks

## 2014-09-26 ENCOUNTER — Ambulatory Visit (INDEPENDENT_AMBULATORY_CARE_PROVIDER_SITE_OTHER): Payer: 59 | Admitting: Family Medicine

## 2014-09-26 VITALS — BP 124/88 | HR 87 | Temp 98.5°F | Resp 18 | Ht 63.75 in | Wt 229.2 lb

## 2014-09-26 DIAGNOSIS — J069 Acute upper respiratory infection, unspecified: Secondary | ICD-10-CM

## 2014-09-26 LAB — POCT CBC
Granulocyte percent: 65.6 %G (ref 37–80)
HEMATOCRIT: 42.4 % (ref 37.7–47.9)
Hemoglobin: 13.9 g/dL (ref 12.2–16.2)
Lymph, poc: 3.4 (ref 0.6–3.4)
MCH, POC: 30 pg (ref 27–31.2)
MCHC: 32.7 g/dL (ref 31.8–35.4)
MCV: 91.7 fL (ref 80–97)
MID (cbc): 0.8 (ref 0–0.9)
MPV: 7.6 fL (ref 0–99.8)
POC GRANULOCYTE: 8.1 — AB (ref 2–6.9)
POC LYMPH PERCENT: 27.5 %L (ref 10–50)
POC MID %: 6.9 % (ref 0–12)
Platelet Count, POC: 338 10*3/uL (ref 142–424)
RBC: 4.62 M/uL (ref 4.04–5.48)
RDW, POC: 13.9 %
WBC: 12.3 10*3/uL — AB (ref 4.6–10.2)

## 2014-09-26 LAB — POCT INFLUENZA A/B
INFLUENZA A, POC: NEGATIVE
Influenza B, POC: NEGATIVE

## 2014-09-26 LAB — POCT RAPID STREP A (OFFICE): RAPID STREP A SCREEN: NEGATIVE

## 2014-09-26 NOTE — Patient Instructions (Signed)
It looks like you have a viral URI.  Rest and drink plenty of fluids, use OTC medication from the list as needed If you are not feeling better in the next few days please give me a call- Sooner if worse.

## 2014-09-26 NOTE — Progress Notes (Signed)
[facility] [address], [place] [address] [phone]  Date:  09/26/2014   Name:  Melissa May   DOB:  1981-01-24   MRN:  [id number]  PCP:  Helen Hashimoto., MD    Chief Complaint: Headache; Chest tightness; Cough; and Fatigue   History of Present Illness:  Melissa May is a 34 y.o. very pleasant female patient who presents with the following:  She is here today with illness.  "Everyone in my house has been sick;" it started with her husband, then her son, and now she is sick.  She notes sinus pressure, ear hurts, ST, drainage, cough, mild tightness in her chest.  She has a 26 month old son at home- he is her first child.  He is well but seemed to already have this illness  She has had these sx for about 2 days. She has not noted a fever.   No GI symptoms She is nursing currently.    She has tried some mucinex.   She just started coughing this am and it is a bit productive.   No antipyretics this am, she did take ibuprofen last night  Patient Active Problem List   Diagnosis Date Noted  . H/O: depression 04/19/2014  . Cesarean delivery delivered 04/16/2014  . Postpartum care following cesarean delivery (8/17) 04/16/2014  . Pregnancy 04/15/2014  . Hypermenorrhea 12/21/2012  . Cough 09/17/2011    Past Medical History  Diagnosis Date  . GERD (gastroesophageal reflux disease)   . Depression   . Seasonal allergies   . Asthma     prn inhaler  . TMJ syndrome   . 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 Obese   . Carpal tunnel syndrome on both sides     onset during pregnancy    Past Surgical History  Procedure Laterality Date  . Upper gastrointestinal endoscopy  2009  . Wisdom tooth extraction    . Strabismus surgery      as an infant  . Cesarean section N/A 04/16/2014    Procedure: CESAREAN SECTION;  Surgeon: Elveria Royals, MD;  Location: [facility];  Service: Obstetrics;  Laterality: N/A;  . Strabismus surgery Right 06/22/2014   Procedure: REPAIR STRABISMUS RIGHT EYE;  Surgeon: Derry Skill, MD;  Location: [facility];  Service: Ophthalmology;  Laterality: Right;    History  Substance Use Topics  . Smoking status: Never Smoker   . Smokeless tobacco: Never Used  . Alcohol Use: No    Family History  Problem Relation Age of Onset  . Cancer Father   . May cancer Father   . Hypertension Father   . Heart disease Maternal Grandfather   . Rheum arthritis Mother   . Asthma Paternal Grandmother   . Diabetes Paternal Grandmother   . Hypertension Paternal Grandmother   . Allergies Brother   . Rheum arthritis Maternal Grandmother   . Stroke Maternal Grandmother     Allergies  Allergen Reactions  . Lexapro [Escitalopram Oxalate] Nausea And Vomiting  . Adhesive [Tape] Rash  . Morphine And Related Itching    Medication list has been reviewed and updated.  Current Outpatient Prescriptions on File Prior to Visit  Medication Sig Dispense Refill  . albuterol (PROVENTIL HFA;VENTOLIN HFA) 108 (90 BASE) MCG/ACT inhaler Inhale into the lungs every 6 (six) hours as needed for wheezing or shortness of breath.    . cetirizine (ZYRTEC) 10 MG tablet Take 10 mg by mouth  at bedtime.     . famotidine (PEPCID) 10 MG tablet Take 10 mg by mouth at bedtime.    Marland Kitchen FLUoxetine (PROZAC) 40 MG capsule Take 40 mg by mouth daily.    . Prenatal Vit-Fe Fumarate-FA (PRENATAL MULTIVITAMIN) TABS tablet Take 1 tablet by mouth at bedtime.      No current facility-administered medications on file prior to visit.    Review of Systems:  As per HPI- otherwise negative.   Physical Examination: Filed Vitals:   09/26/14 1501  BP: 124/88  Pulse: 87  Temp: 98.5 F (36.9 C)  Resp: 18   Filed Vitals:   09/26/14 1501  Height: 5' 3.75" (1.619 m)  Weight: 229 lb 3.2 oz (103.964 kg)   Body mass index is 39.66 kg/(m^2). Ideal Body Weight: Weight in (lb) to have BMI = 25: 144.2  GEN: WDWN, NAD, Non-toxic, A & O x 3, obese  but OW looks well HEENT: Atraumatic, Normocephalic. Neck supple. No masses, No LAD.  Bilateral TM wnl, oropharynx normal.  PEERL,EOMI.   Ears and Nose: No external deformity. CV: RRR, No M/G/R. No JVD. No thrill. No extra heart sounds. PULM: CTA B, no wheezes, crackles, rhonchi. No retractions. No resp. distress. No accessory muscle use. EXTR: No c/c/e NEURO Normal gait.  PSYCH: Normally interactive. Conversant. Not depressed or anxious appearing.  Calm demeanor.   Results for orders placed or performed in visit on 09/26/14  POCT CBC  Result Value Ref Range   WBC 12.3 (A) 4.6 - 10.2 K/uL   Lymph, poc 3.4 0.6 - 3.4   POC LYMPH PERCENT 27.5 10 - 50 %L   MID (cbc) 0.8 0 - 0.9   POC MID % 6.9 0 - 12 %M   POC Granulocyte 8.1 (A) 2 - 6.9   Granulocyte percent 65.6 37 - 80 %G   RBC 4.62 4.04 - 5.48 M/uL   Hemoglobin 13.9 12.2 - 16.2 g/dL   HCT, POC 42.4 37.7 - 47.9 %   MCV 91.7 80 - 97 fL   MCH, POC 30.0 27 - 31.2 pg   MCHC 32.7 31.8 - 35.4 g/dL   RDW, POC 13.9 %   Platelet Count, POC 338 142 - 424 K/uL   MPV 7.6 0 - 99.8 fL  POCT Influenza A/B  Result Value Ref Range   Influenza A, POC Negative    Influenza B, POC Negative   POCT rapid strep A  Result Value Ref Range   Rapid Strep A Screen Negative Negative    Assessment and Plan: Viral URI - Plan: POCT CBC, POCT Influenza A/B, POCT rapid strep A  Reassured that she likely has a viral URI.  She will rest and use OTC medications as needed (gave her list of OTC medications for use in pregnancy and BF), and she will let me know if not feeling better soon   Signed Lamar Blinks, MD

## 2014-10-19 ENCOUNTER — Ambulatory Visit (INDEPENDENT_AMBULATORY_CARE_PROVIDER_SITE_OTHER): Payer: 59 | Admitting: Obstetrics & Gynecology

## 2014-10-19 ENCOUNTER — Encounter: Payer: Self-pay | Admitting: Obstetrics & Gynecology

## 2014-10-19 VITALS — BP 120/64 | HR 64 | Resp 16 | Wt 228.2 lb

## 2014-10-19 DIAGNOSIS — Z30431 Encounter for routine checking of intrauterine contraceptive device: Secondary | ICD-10-CM

## 2014-10-19 NOTE — Progress Notes (Signed)
34 y.o. G22P1001 Married Caucasian female presents for recheck of IUD.  Reports consistent spotting since placement.  Using only a panty liner.  No pain.  No pain with intercourse.   LMP:  No LMP recorded.  Healthy female:  WNWD WF, NAD  Groin: no inguinal nodes palpated  Pelvic exam: Vulva;normal female genitalia Vagina:normal vagina and scant, dark blood present Cervix:Non-tender, Negative CMT, no lesions or redness, 2cm IUD string noted Uterus:normal shape, position and consistency   A: recheck after placement of Mirena IUD  P: pt is going to call in three weeks and let me know if still spotting.  If so, will plan PUS.  Pt was breast feeding, so had amenorrhea before IUD was placed so it is reasonable that spotting has last this long.

## 2014-11-11 ENCOUNTER — Ambulatory Visit (INDEPENDENT_AMBULATORY_CARE_PROVIDER_SITE_OTHER): Payer: 59 | Admitting: Family Medicine

## 2014-11-11 VITALS — BP 100/64 | HR 65 | Temp 97.5°F | Resp 20 | Ht 63.75 in | Wt 227.0 lb

## 2014-11-11 DIAGNOSIS — J4520 Mild intermittent asthma, uncomplicated: Secondary | ICD-10-CM

## 2014-11-11 DIAGNOSIS — J45909 Unspecified asthma, uncomplicated: Secondary | ICD-10-CM | POA: Insufficient documentation

## 2014-11-11 DIAGNOSIS — J0100 Acute maxillary sinusitis, unspecified: Secondary | ICD-10-CM

## 2014-11-11 MED ORDER — AMOXICILLIN-POT CLAVULANATE 875-125 MG PO TABS: 1.0000 | ORAL_TABLET | Freq: Two times a day (BID) | ORAL | Status: DC

## 2014-11-11 NOTE — Patient Instructions (Signed)

## 2014-11-11 NOTE — Progress Notes (Signed)
This chart was scribed for Robyn Haber, MD by Einar Pheasant, Medical Scribe. This patient was seen in room 10 and the patient's care was started at 1:37 PM.  Subjective:    Patient ID: Melissa May, female    DOB: 17-Mar-1981, 34 y.o.   MRN: [id number]  Chief Complaint  Patient presents with   Sinusitis    sinus infection over the last week.  now with cough and chest tightness.      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 She now has a cough with some mild chest tightness. Pt does have h/o asthama and had to use her albuterol inhaler last mont. Pt reports possible sick contacts secondary to her 105 month old being in daycare. Pt denies fever, neck pain, sore throat, visual disturbance, CP, cough, SOB, abdominal pain, nausea, emesis, diarrhea, urinary symptoms, back pain, HA, weakness, numbness and rash as associated symptoms.    Review of Systems  Constitutional: Negative for fever and chills.  HENT: Positive for congestion. Negative for ear pain, rhinorrhea, sinus pressure, sore throat, trouble swallowing and voice change.   Eyes: Negative for discharge.  Respiratory: Positive for cough and chest tightness. Negative for shortness of breath, wheezing and stridor.   Cardiovascular: Negative for chest pain.  Gastrointestinal: Negative for abdominal pain.  Genitourinary: Negative for difficulty urinating.   Objective:   Physical Exam  Vitals reviewed. BP 100/64 mmHg   Pulse 65   Temp(Src) 97.5 F (36.4 C) (Oral)   Resp 20   Ht 5' 3.75" (1.619 m)   Wt 227 lb (102.967 kg)   BMI 39.28 kg/m2   SpO2 98%  General Appearance:    Alert, cooperative, no distress, appears stated age  Head:    Normocephalic, without obvious abnormality, atraumatic  Eyes:    PERRL, conjunctiva/corneas clear, EOM's intact, fundi    benign, both eyes  Ears:     Normal TM's and external ear canals, both ears  Nose:   Swollen nasal passages with mucopurulent discharge.  Throat:   Lips, mucosa, and tongue normal; teeth and gums normal  Neck:   Supple, symmetrical, trachea midline, no adenopathy;    thyroid:  no enlargement/tenderness/nodules; no carotid   bruit or JVD  Back:     Symmetric, no curvature, ROM normal, no CVA tenderness  Lungs:     Clear to auscultation bilaterally, respirations unlabored  Chest Wall:    No tenderness or deformity   Heart:    Regular rate and rhythm, S1 and S2 normal, no murmur, rub   or gallop  Abdomen:     Soft, non-tender, bowel sounds active all four quadrants,    no masses, no organomegaly  Extremities:   Extremities normal, atraumatic, no cyanosis or edema  Pulses:   2+ and symmetric all extremities  Skin:   Skin color, texture, turgor normal, no rashes or lesions  Lymph nodes:   Cervical, supraclavicular, and axillary nodes normal  Neurologic:   CNII-XII intact, normal strength, sensation and reflexes    throughout   Assessment & Plan:   This chart was scribed in my presence and reviewed by me personally.    ICD-9-CM ICD-10-CM   1. Acute maxillary sinusitis, recurrence not specified 461.0 J01.00 amoxicillin-clavulanate (AUGMENTIN) 875-125 MG per tablet  2. Extrinsic asthma, mild intermittent, uncomplicated 562.13 Y86.57  Signed, Robyn Haber, MD

## 2014-11-19 ENCOUNTER — Other Ambulatory Visit: Payer: Self-pay | Admitting: Obstetrics & Gynecology

## 2014-11-19 ENCOUNTER — Telehealth: Payer: Self-pay | Admitting: Obstetrics & Gynecology

## 2014-11-19 DIAGNOSIS — R1030 Lower abdominal pain, unspecified: Secondary | ICD-10-CM

## 2014-11-19 DIAGNOSIS — Z30431 Encounter for routine checking of intrauterine contraceptive device: Secondary | ICD-10-CM

## 2014-11-19 DIAGNOSIS — N938 Other specified abnormal uterine and vaginal bleeding: Secondary | ICD-10-CM

## 2014-11-19 NOTE — Telephone Encounter (Signed)
Spoke with patient. Patient states that she is calling to provide update to [facility] regarding bleeding with her IUD. Patient was last seen for IUD recheck with [facility] on 10/19/2014. Mirena was inserted on 09/07/2014 with Dr.Miller. States bleeding had almost stopped until Saturday 3/19. Patient had intercourse Friday and woke up Saturday morning with increased "dark brown" bleeding and cramping. Patient is wearing a panti liner for bleeding. Pain is constant and more predominately on the left side. "It is so bad that it is hard to walk sometimes. It hurts worse than after my c-section." Patient is able to feel IUD strings "I think they are shorter though." Pain is currently a 4/10. Has taken Ibuprofen with slight relief. Per OV from 10/19/2014 if bleeding were to continue patient needs follow up with PUS. Advised patient will need to speak with covering provider regarding follow up and return call. Patient is agreeable.

## 2014-11-19 NOTE — Telephone Encounter (Signed)
Spoke with patient. Advised patient appointment is scheduled for 3/29 at 7:40am as latest appointment is at 3:40pm. Patient is agreeable to date and time. Advised patient will need to drink 32 ounces of water before appointment as she will need a full bladder. Patient is agreeable. Address provided to [place]. Patient will call to be seen sooner for evaluation if symptoms worsen. Will be seen at [facility] if symptoms worsen after business hours.  Routing to provider for final review. Patient agreeable to disposition. Will close encounter

## 2014-11-19 NOTE — Telephone Encounter (Signed)
Spoke with patient. Advised it is recommended that she schedule PUS for Thursday 3/24 with Dr.Miller. Offered appointment on 3/24 at 1:30pm but patient declines. "I have a class every Thursday from 1:45-3:45 that I can not miss." Advised patient this is strongly encouraged. Patient declines until she can be seen after 3:45pm. Next PUS appointment at [facility] with Murphy is on 4/14.. Advised patient will need to be seen sooner for evaluation. Patient is agreeable. Advised will need to speak with Dr.Miller upon return from OR in regards to follow up and return call. Patient is agreeable. Advised patient if symptoms worsen will need to be seen at [facility] for evaluation. Patient is agreeable.

## 2014-11-19 NOTE — Telephone Encounter (Signed)
Spoke with patient. Patient is agreeable to schedule at [facility] imaging. Available Monday, Wednesday, Friday mornings before 10am and any day after 4pm. Advised will call [facility] imaging to schedule and return call. Patient is agreeable. Spoke with [facility] imaging. Appointment scheduled for 11/23/2014 at 10:40am at [place] location. Spoke with patient. Patient declines this appointment due to schedule. Patient requesting appointment after 4pm. Advised will call [facility] to reschedule. Patient is agreeable. Spoke with [facility] who states latest appointment is 3:40pm for PUS. Rescheduled appointment for 7:40am on 3/29 due to patient's schedule.

## 2014-11-19 NOTE — Telephone Encounter (Signed)
OK to schedule this as outpt at [facility] or [facility] to accomodate pt's needs.  Could be an ovarian cyst as well.  Schedule as soon as possible in accordance with her needs, please.

## 2014-11-19 NOTE — Telephone Encounter (Signed)
Pt had iud placed January 8. Was told to call if bleeding continued. Still bleeding - cramping since Saturday - 'really bad'  Chart to triage

## 2014-11-23 ENCOUNTER — Other Ambulatory Visit: Payer: 59

## 2014-11-27 ENCOUNTER — Ambulatory Visit
Admission: RE | Admit: 2014-11-27 | Discharge: 2014-11-27 | Disposition: A | Discharge status: Home / Self Care | Payer: 59 | Source: Ambulatory Visit | Attending: Obstetrics & Gynecology | Admitting: Obstetrics & Gynecology

## 2014-11-27 ENCOUNTER — Other Ambulatory Visit: Payer: 59

## 2014-11-27 DIAGNOSIS — N938 Other specified abnormal uterine and vaginal bleeding: Secondary | ICD-10-CM

## 2014-11-27 DIAGNOSIS — R1030 Lower abdominal pain, unspecified: Secondary | ICD-10-CM

## 2014-11-27 DIAGNOSIS — Z30431 Encounter for routine checking of intrauterine contraceptive device: Secondary | ICD-10-CM

## 2014-11-29 ENCOUNTER — Telehealth: Payer: Self-pay | Admitting: Emergency Medicine

## 2014-11-29 NOTE — Telephone Encounter (Signed)
-----   Message from Megan Salon, MD sent at 11/27/2014  1:16 PM EDT ----- Please inform pt that her PUS was negative for any abnormal findings.  IUD is in correct location.  How is pain?  If still present, needs OV this week if possible with her schedule.

## 2014-11-29 NOTE — Telephone Encounter (Signed)
Spoke with Melissa May and message from Dr. Sabra Heck given. Melissa May states she has not had any pain for approximately 3 days and no bleeding today. Declines office visit at this time, she states she would like to speak with her husband and will return call with any change in status or if she would like to follow up with Dr. Sabra Heck.   Routing to provider for final review. Melissa May agreeable to disposition. Will close encounter

## 2014-11-29 NOTE — Telephone Encounter (Signed)
Message left to return call to Staphany Ditton at [phone].    

## 2015-01-17 ENCOUNTER — Ambulatory Visit: Payer: 59

## 2015-02-07 IMAGING — RF DG HYSTEROGRAM
3 series · 3 of 3 positions shown · IV contrast (omnipaque)
Comparison: None.

FLUOROSCOPY TIME:  1 min, 6 seconds.

CLINICAL DATA: Infertility.

EXAM:
HYSTEROSALPINGOGRAM
TECHNIQUE: Following cleansing of the cervix and vagina with Betadine solution,
a hysterosalpingogram was performed using a 5-French
hysterosalpingogram catheter and Omnipaque 300 contrast. The patient
tolerated the examination without difficulty.

[Series 1: run · 1 of 1 slices shown (1 of 3)]
[im 1/1]
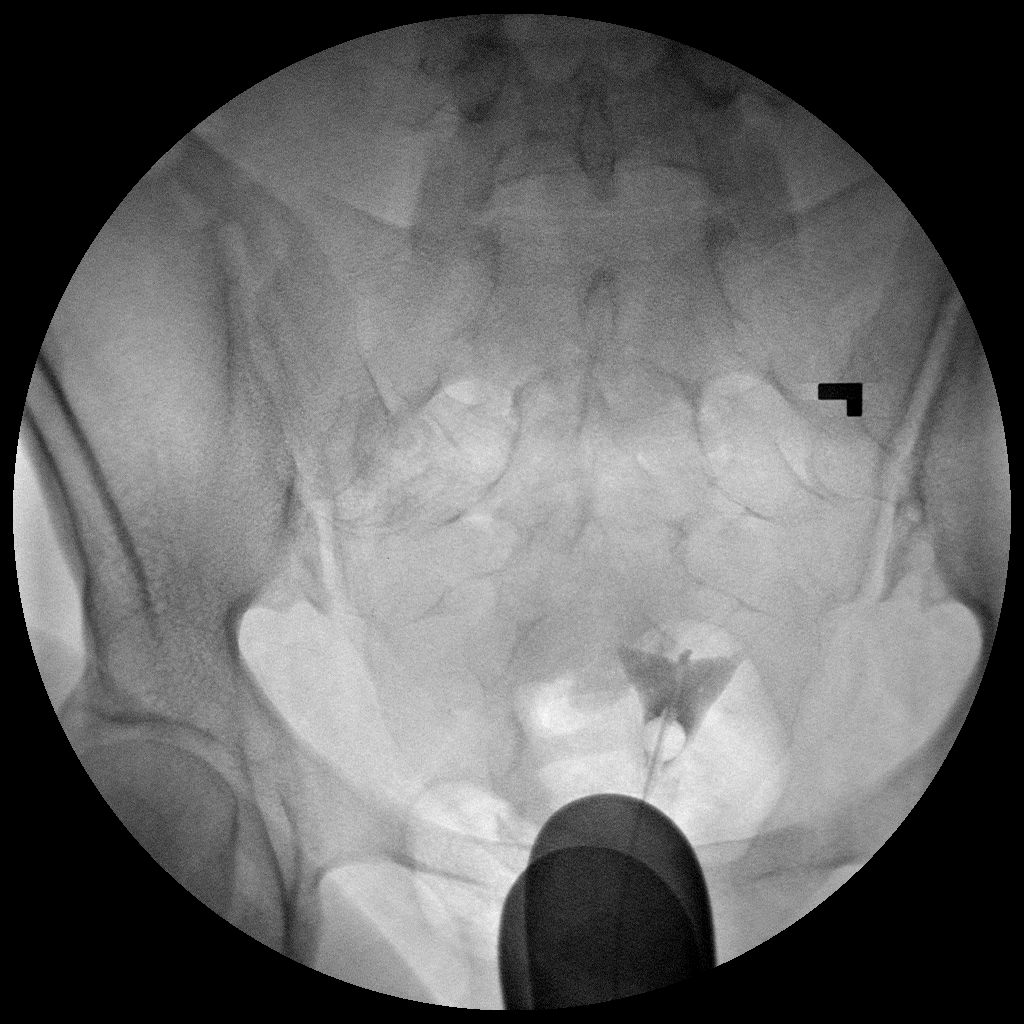

[Series 2: run · 1 of 1 slices shown (2 of 3)]
[im 1/1]
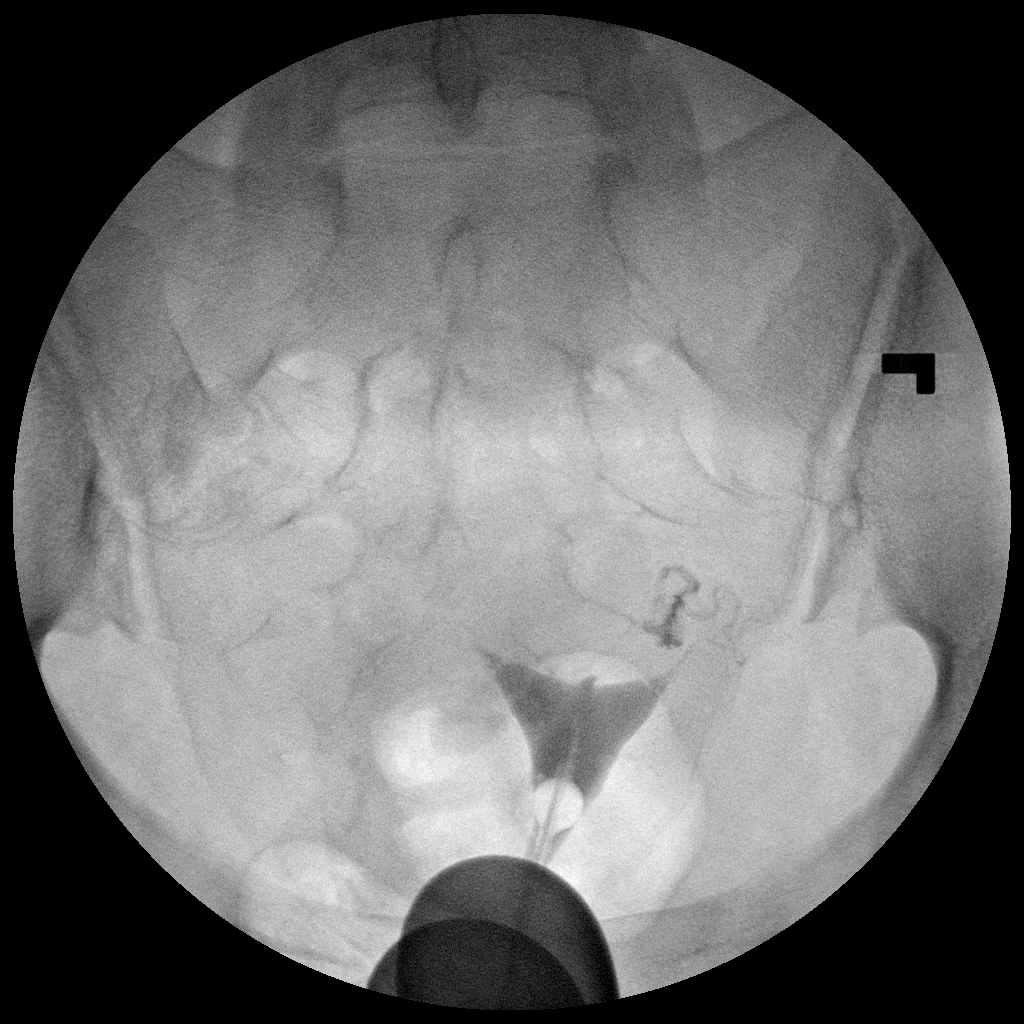

[Series 3: run · 1 of 1 slices shown (3 of 3)]
[im 1/1]
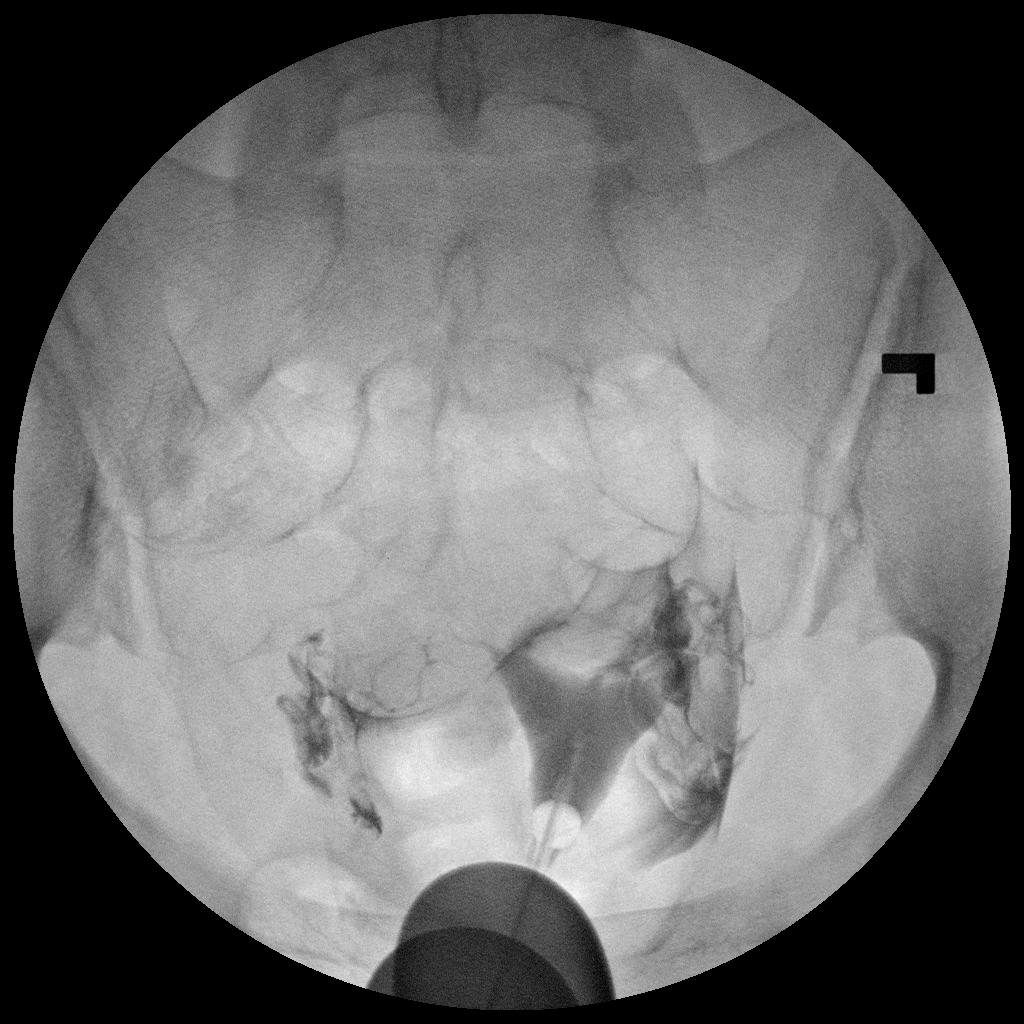

[3 of 3 positions shown; findings below may reference images not displayed]

FINDINGS: The endometrial cavity is normal in appearance and contour.

Opacification of both fallopian tubes is seen. Both tubes appear
normal. Intraperitoneal spill of contrast from both fallopian tubes
is demonstrated.
IMPRESSION: Patent bilateral fallopian tubes.

## 2015-03-14 IMAGING — US US OB TRANSVAGINAL
1 series · 14 of 28 positions shown · non-contrast
Comparison: None.

CLINICAL DATA: Vaginal bleeding. Gestational age by LMP of six
weeks 0 days.

EXAM:
OBSTETRIC <14 WK US AND TRANSVAGINAL OB US
TECHNIQUE: Both transabdominal and transvaginal ultrasound examinations were
performed for complete evaluation of the gestation as well as the
maternal uterus, adnexal regions, and pelvic cul-de-sac.
Transvaginal technique was performed to assess early pregnancy.

[Series 1: us ob comp less 14 wks · 14 of 63 slices shown]
[im 3/63]
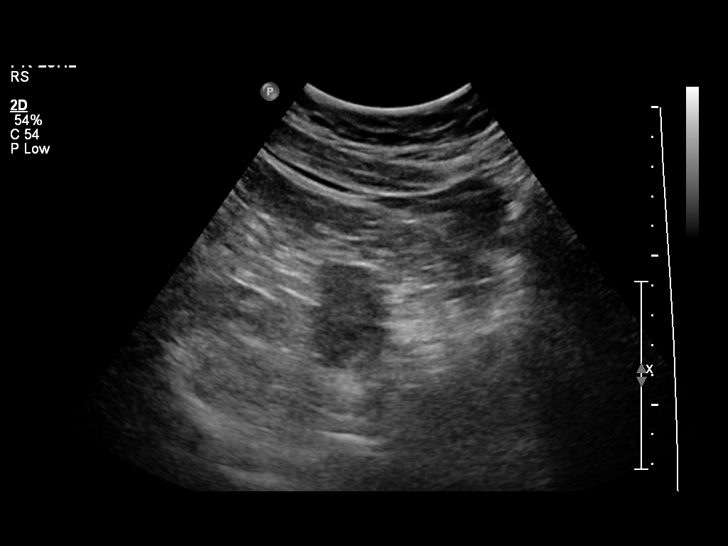
[im 7/63]
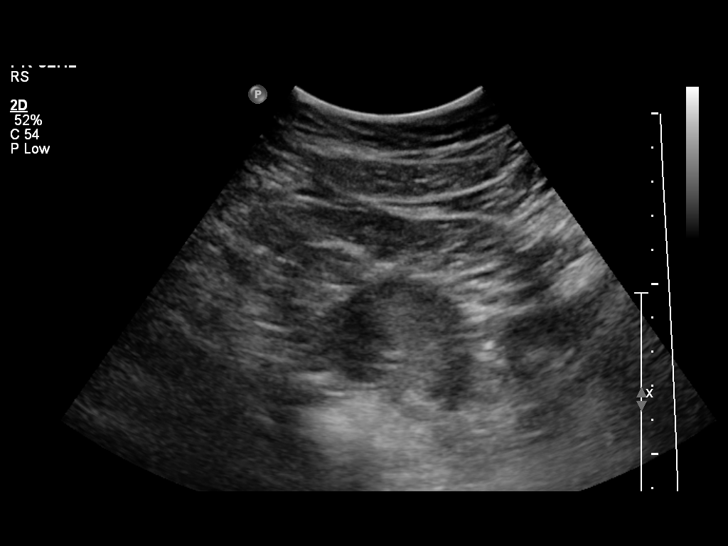
[im 12/63]
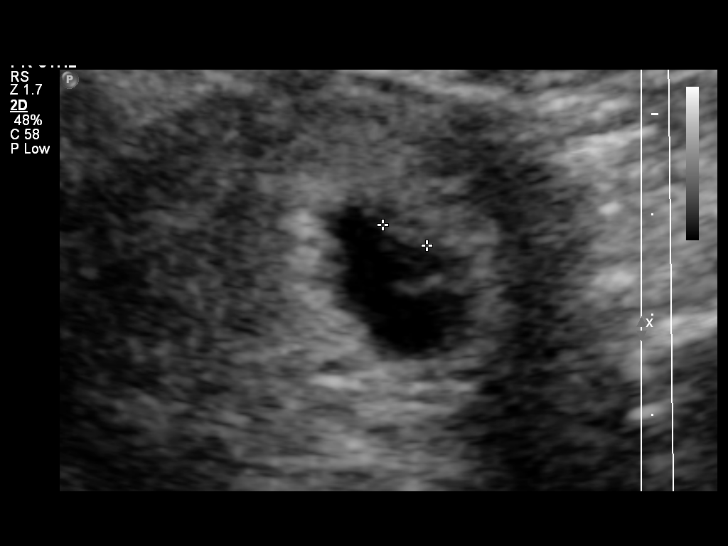
[im 17/63]
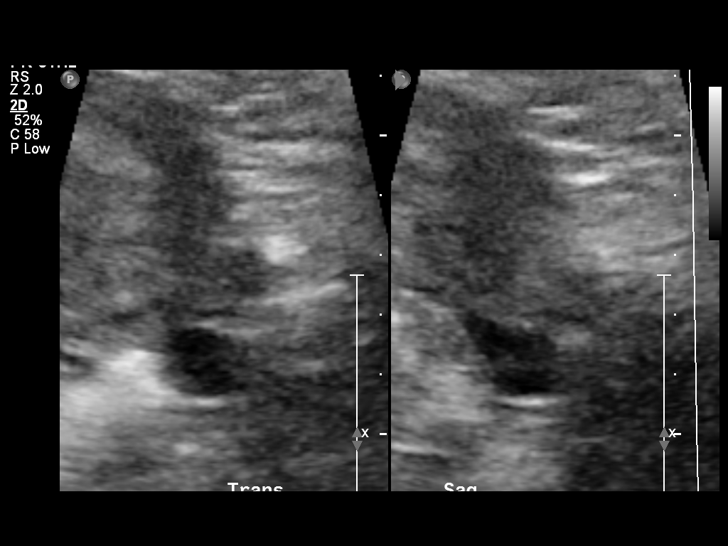
[im 21/63]
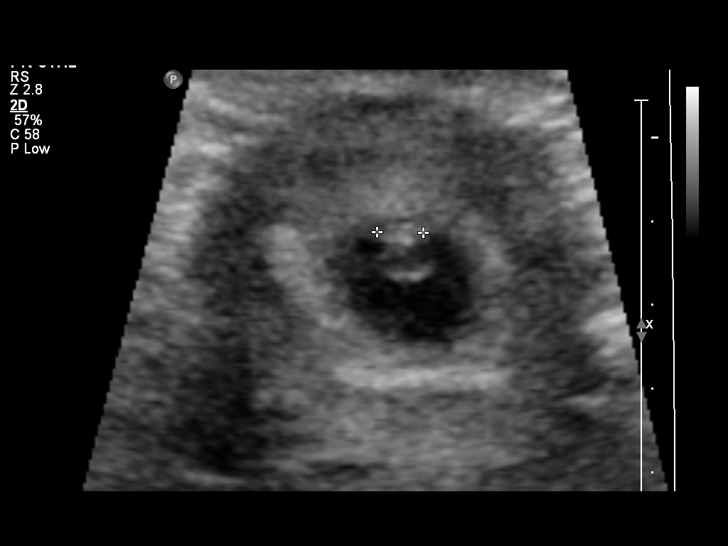
[im 26/63]
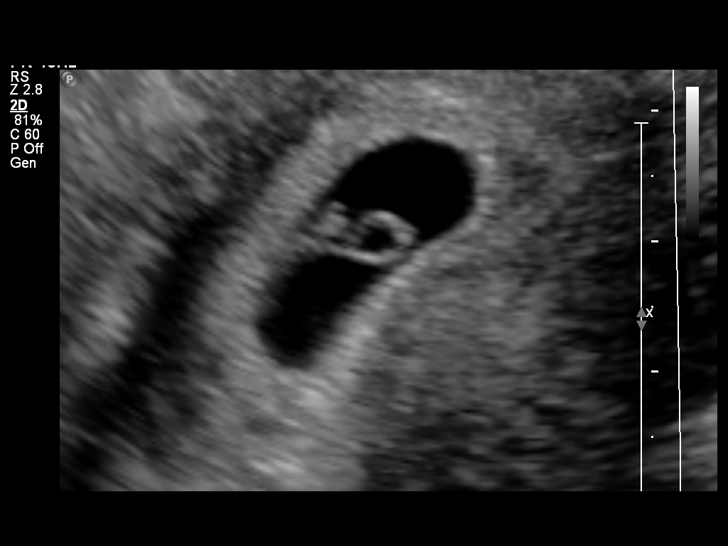
[im 30/63]
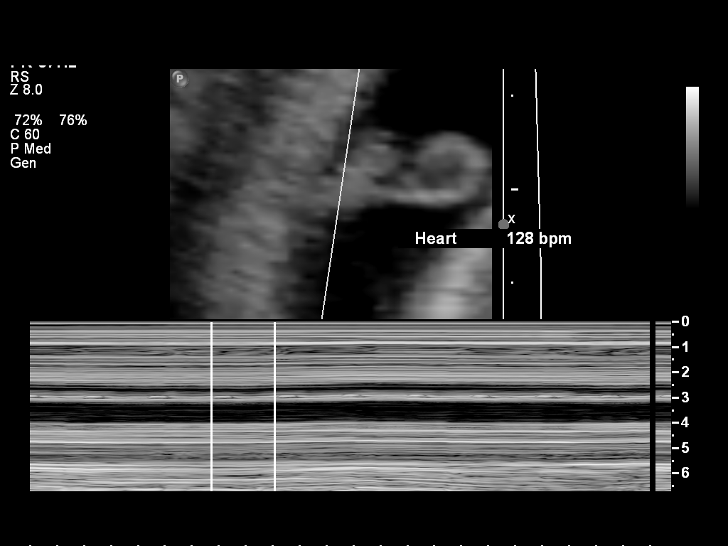
[im 35/63]
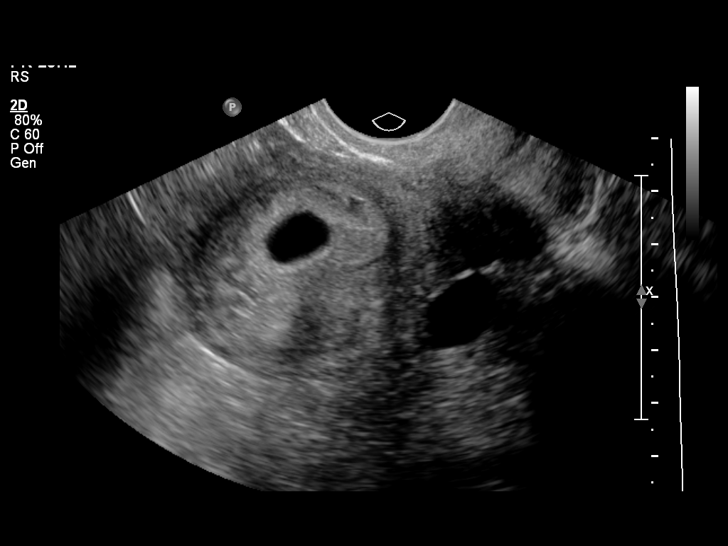
[im 40/63]
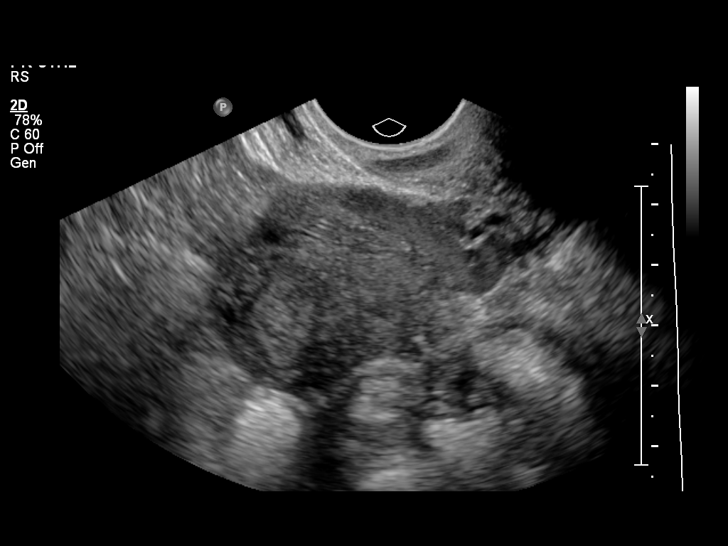
[im 44/63]
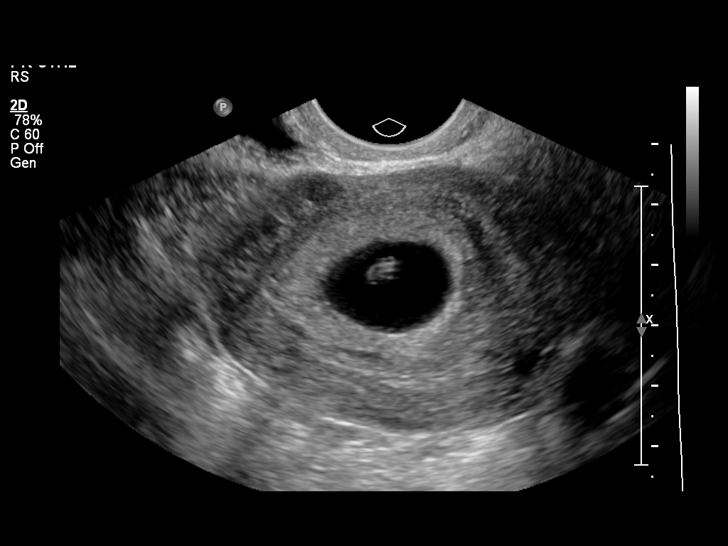
[im 49/63]
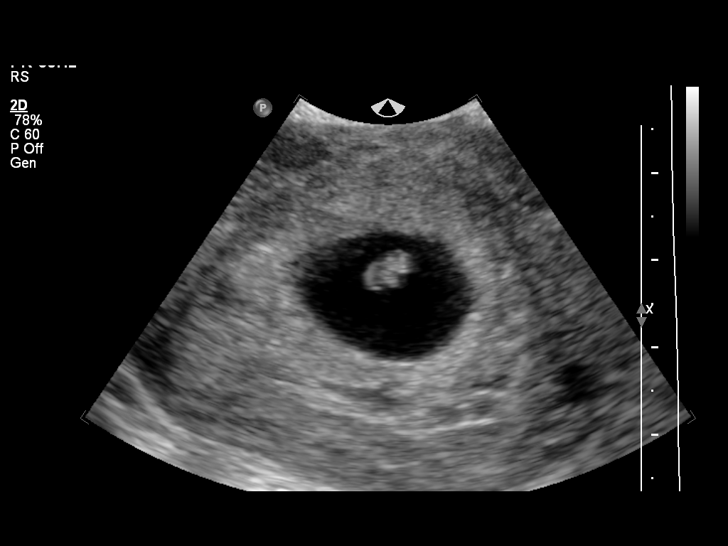
[im 53/63]
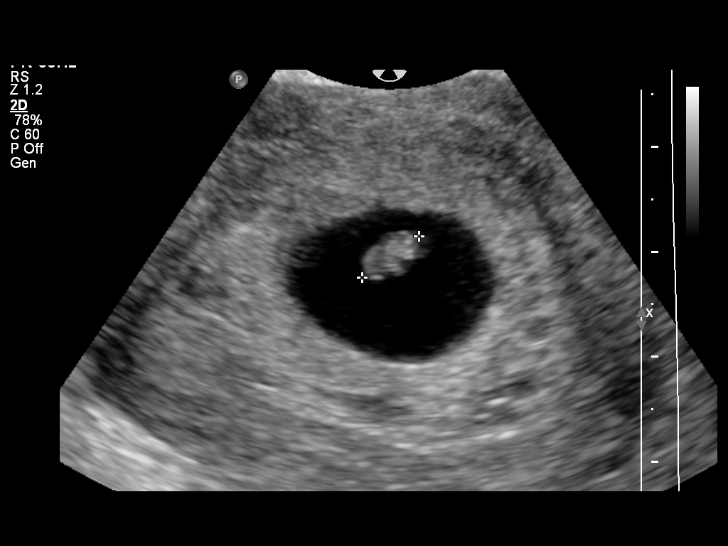
[im 58/63]
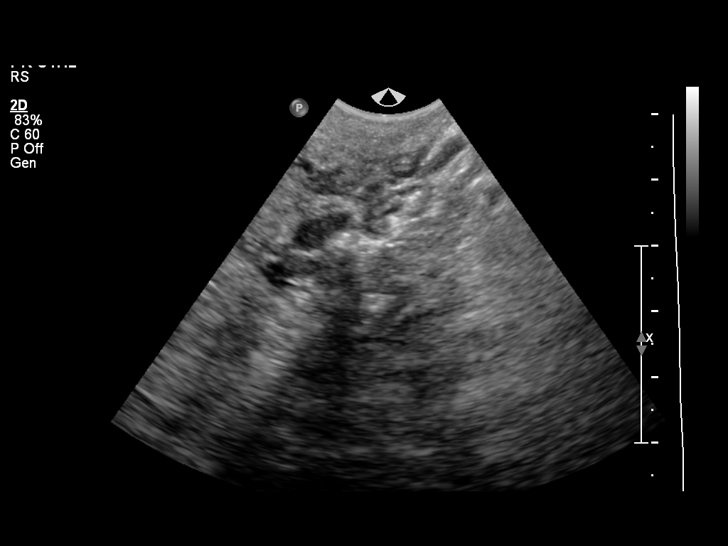
[im 63/63]
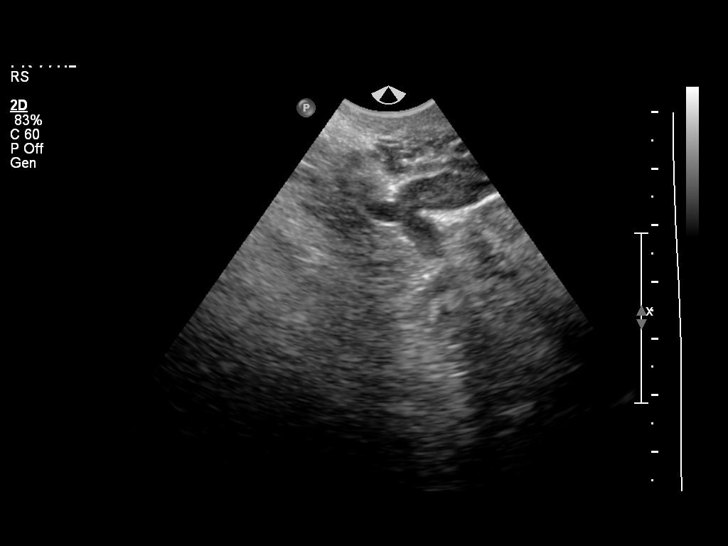

[14 of 28 positions shown; findings below may reference images not displayed]

FINDINGS: Intrauterine gestational sac: Visualized/normal in shape.

Yolk sac:  Visualized

Embryo:  Visualized

Cardiac Activity: Visualized

Heart Rate:  128 bpm

CRL:   6  mm   6 w 3 d                  US EDC: 04/20/2014

Maternal uterus/adnexae: No mass or free fluid identified. Small
right ovarian corpus luteum cyst noted.
IMPRESSION: Single living IUP measuring 6 weeks 3 days, which is concordant with
LMP.

No significant maternal uterine or adnexal abnormality identified.

## 2015-03-29 ENCOUNTER — Other Ambulatory Visit: Payer: Self-pay | Admitting: Orthopedic Surgery

## 2015-04-01 DIAGNOSIS — M654 Radial styloid tenosynovitis [de Quervain]: Secondary | ICD-10-CM

## 2015-04-01 HISTORY — DX: Radial styloid tenosynovitis (de quervain): M65.4

## 2015-04-16 ENCOUNTER — Encounter (HOSPITAL_BASED_OUTPATIENT_CLINIC_OR_DEPARTMENT_OTHER): Payer: Self-pay | Admitting: *Deleted

## 2015-04-18 LAB — TYPE AND SCREEN
ABO/RH(D): O NEG
ANTIBODY SCREEN: NEGATIVE
Unit division: 0
Unit division: 0

## 2015-04-18 NOTE — H&P (Signed)
Melissa May is an 34 y.o. female.   CC / Reason for Visit: Bilateral wrist pain HPI: This patient returns for reevaluation, indicating that the left side is persisting in its pain, now also triggering.  The injection on 12-31-14 was not helpful at all.  Her symptoms have now been present for greater than a year and it seems to be getting worse.  HPI 01-08-15: This patient returns for reevaluation, indicating that following the bilateral de Quervain's injections on 08-08-14, both sides resolved until about 2 weeks ago when she began to have some symptoms on the left side.  She started again using ibuprofen, stretches, and her brace and her symptoms persist.  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  She is now 2 months post-partum.  The right side began first, and is the worst.  She has had a forearm-based thumb spica splint as it would appear from her description, for nighttime use.  She is had no other treatment.  Past Medical History  Diagnosis Date  . GERD (gastroesophageal reflux disease)   . Depression   . Asthma     prn inhaler  . TMJ syndrome   . 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 Obese   . De Quervain's tenosynovitis, left 04/2015    Past Surgical History  Procedure Laterality Date  . Upper gastrointestinal endoscopy  2009  . Wisdom tooth extraction    . Strabismus surgery      as an infant  . Cesarean section N/A 04/16/2014    Procedure: CESAREAN SECTION;  Surgeon: Elveria Royals, MD;  Location: [facility];  Service: Obstetrics;  Laterality: N/A;  . Strabismus surgery Right 06/22/2014    Procedure: REPAIR STRABISMUS RIGHT EYE;  Surgeon: Derry Skill, MD;  Location: [facility];  Service: Ophthalmology;  Laterality: Right;    Family History  Problem Relation Age of Onset  . Cancer Father   . Kidney cancer Father   .  Hypertension Father   . Heart disease Maternal Grandfather   . Rheum arthritis Mother   . Asthma Paternal Grandmother   . Diabetes Paternal Grandmother   . Hypertension Paternal Grandmother   . Allergies Brother   . Rheum arthritis Maternal Grandmother   . Stroke Maternal Grandmother    Social History:  reports that she has never smoked. She has never used smokeless tobacco. She reports that she drinks alcohol. She reports that she does not use illicit drugs.  Allergies:  Allergies  Allergen Reactions  . Lexapro [Escitalopram Oxalate] Nausea And Vomiting  . Adhesive [Tape] Rash  . Morphine And Related Itching    No prescriptions prior to admission    No results found for this or any previous visit (from the past 48 hour(s)). No results found.  Review of Systems  All other systems reviewed and are negative.   Height 5' 3.75" (1.619 m), weight 99.791 kg (220 lb), not currently breastfeeding. Physical Exam  Constitutional:  WD, WN, NAD HEENT:  NCAT, EOMI Neuro/Psych:  Alert & oriented to person, place, and time; appropriate mood & affect Lymphatic: No generalized UE edema or lymphadenopathy Extremities / MSK:  Both UE are normal with respect to appearance, ranges of motion, joint stability, muscle strength/tone, sensation, & perfusion except as otherwise noted:  Left side with markedly positive Finkelstein and EPB stress.  Tender to palpation over  the first dorsal compartment tendons, not the TMC, no significant increased pain with TMC stress or grind testing.  Triggering of the first dorsal compartment tendons is noted.  Labs / X-rays:  No radiographic studies obtained today.  Assessment: Left de Quervain's tenosynovitis  Plan:  I discussed these findings with her, and recommended operative management.  She will confer with her employer regarding the planning for this and contact our surgery coordinator when she wishes to proceed.  The details of the operative procedure  were discussed with the patient.  Questions were invited and answered.  I used drawings in the course of this explanation and discussed the usual and most frequent risks or complications, to include superficial radial neuritis and tendon instability.  Dhaval Woo A. 04/18/2015, 10:17 AM

## 2015-04-22 ENCOUNTER — Ambulatory Visit (HOSPITAL_BASED_OUTPATIENT_CLINIC_OR_DEPARTMENT_OTHER): Payer: 59 | Admitting: Certified Registered"

## 2015-04-22 ENCOUNTER — Encounter (HOSPITAL_BASED_OUTPATIENT_CLINIC_OR_DEPARTMENT_OTHER): Payer: Self-pay | Admitting: Certified Registered"

## 2015-04-22 ENCOUNTER — Encounter (HOSPITAL_BASED_OUTPATIENT_CLINIC_OR_DEPARTMENT_OTHER)
Admission: RE | Disposition: A | Discharge status: Home / Self Care | Payer: Self-pay | Source: Ambulatory Visit | Attending: Orthopedic Surgery

## 2015-04-22 ENCOUNTER — Ambulatory Visit (HOSPITAL_BASED_OUTPATIENT_CLINIC_OR_DEPARTMENT_OTHER)
Admission: RE | Admit: 2015-04-22 | Discharge: 2015-04-22 | Disposition: A | Discharge status: Home / Self Care | Payer: 59 | Source: Ambulatory Visit | Attending: Orthopedic Surgery | Admitting: Orthopedic Surgery

## 2015-04-22 DIAGNOSIS — F329 Major depressive disorder, single episode, unspecified: Secondary | ICD-10-CM | POA: Diagnosis not present

## 2015-04-22 DIAGNOSIS — Z6841 Body Mass Index (BMI) 40.0 and over, adult: Secondary | ICD-10-CM | POA: Insufficient documentation

## 2015-04-22 DIAGNOSIS — Z888 Allergy status to other drugs, medicaments and biological substances status: Secondary | ICD-10-CM | POA: Insufficient documentation

## 2015-04-22 DIAGNOSIS — J45909 Unspecified asthma, uncomplicated: Secondary | ICD-10-CM | POA: Diagnosis not present

## 2015-04-22 DIAGNOSIS — K219 Gastro-esophageal reflux disease without esophagitis: Secondary | ICD-10-CM | POA: Diagnosis not present

## 2015-04-22 DIAGNOSIS — M654 Radial styloid tenosynovitis [de Quervain]: Secondary | ICD-10-CM | POA: Diagnosis present

## 2015-04-22 DIAGNOSIS — Z885 Allergy status to narcotic agent status: Secondary | ICD-10-CM | POA: Insufficient documentation

## 2015-04-22 DIAGNOSIS — M2660 Temporomandibular joint disorder, unspecified: Secondary | ICD-10-CM | POA: Diagnosis not present

## 2015-04-22 HISTORY — DX: Radial styloid tenosynovitis (de quervain): M65.4

## 2015-04-22 HISTORY — PX: DORSAL COMPARTMENT RELEASE: SHX5039

## 2015-04-22 SURGERY — RELEASE, FIRST DORSAL COMPARTMENT, HAND
Anesthesia: Monitor Anesthesia Care | Site: Wrist | Laterality: Left

## 2015-04-22 MED ORDER — BUPIVACAINE-EPINEPHRINE (PF) 0.5% -1:200000 IJ SOLN
INTRAMUSCULAR | Status: AC
  Filled 2015-04-22: qty 30

## 2015-04-22 MED ORDER — BUPIVACAINE HCL (PF) 0.5 % IJ SOLN
INTRAMUSCULAR | Status: AC
  Filled 2015-04-22: qty 30

## 2015-04-22 MED ORDER — MEPERIDINE HCL 25 MG/ML IJ SOLN: 6.2500 mg | INTRAMUSCULAR | Status: DC | PRN

## 2015-04-22 MED ORDER — 0.9 % SODIUM CHLORIDE (POUR BTL) OPTIME
TOPICAL | Status: DC | PRN
  Administered 2015-04-22: 120 mL

## 2015-04-22 MED ORDER — PROPOFOL 500 MG/50ML IV EMUL
INTRAVENOUS | Status: AC
  Filled 2015-04-22: qty 50

## 2015-04-22 MED ORDER — OXYCODONE HCL 5 MG PO TABS
5.0000 mg | ORAL_TABLET | Freq: Once | ORAL | Status: AC | PRN
  Administered 2015-04-22: 5 mg via ORAL

## 2015-04-22 MED ORDER — HYDROCODONE-ACETAMINOPHEN 5-325 MG PO TABS: 1.0000 | ORAL_TABLET | Freq: Four times a day (QID) | ORAL | Status: DC | PRN

## 2015-04-22 MED ORDER — GLYCOPYRROLATE 0.2 MG/ML IJ SOLN: 0.2000 mg | Freq: Once | INTRAMUSCULAR | Status: DC | PRN

## 2015-04-22 MED ORDER — LACTATED RINGERS IV SOLN
INTRAVENOUS | Status: DC
  Administered 2015-04-22: 07:00:00 via INTRAVENOUS

## 2015-04-22 MED ORDER — MIDAZOLAM HCL 2 MG/2ML IJ SOLN
INTRAMUSCULAR | Status: AC
  Filled 2015-04-22: qty 2

## 2015-04-22 MED ORDER — CEFAZOLIN SODIUM-DEXTROSE 2-3 GM-% IV SOLR
INTRAVENOUS | Status: AC
  Filled 2015-04-22: qty 50

## 2015-04-22 MED ORDER — LIDOCAINE HCL 1 % IJ SOLN
INTRAMUSCULAR | Status: DC | PRN
  Administered 2015-04-22: 7 mL

## 2015-04-22 MED ORDER — OXYCODONE HCL 5 MG PO TABS
ORAL_TABLET | ORAL | Status: AC
  Filled 2015-04-22: qty 1

## 2015-04-22 MED ORDER — LIDOCAINE HCL (PF) 1 % IJ SOLN
INTRAMUSCULAR | Status: AC
  Filled 2015-04-22: qty 30

## 2015-04-22 MED ORDER — LACTATED RINGERS IV SOLN: INTRAVENOUS | Status: DC

## 2015-04-22 MED ORDER — OXYCODONE HCL 5 MG/5ML PO SOLN: 5.0000 mg | Freq: Once | ORAL | Status: AC | PRN

## 2015-04-22 MED ORDER — FENTANYL CITRATE (PF) 100 MCG/2ML IJ SOLN
INTRAMUSCULAR | Status: AC
  Filled 2015-04-22: qty 2

## 2015-04-22 MED ORDER — CEFAZOLIN SODIUM-DEXTROSE 2-3 GM-% IV SOLR
2.0000 g | INTRAVENOUS | Status: AC
  Administered 2015-04-22: 2 g via INTRAVENOUS

## 2015-04-22 MED ORDER — LIDOCAINE HCL (CARDIAC) 20 MG/ML IV SOLN
INTRAVENOUS | Status: DC | PRN
  Administered 2015-04-22: 60 mg via INTRAVENOUS

## 2015-04-22 MED ORDER — HYDROMORPHONE HCL 1 MG/ML IJ SOLN: 0.2500 mg | INTRAMUSCULAR | Status: DC | PRN

## 2015-04-22 MED ORDER — SCOPOLAMINE 1 MG/3DAYS TD PT72: 1.0000 | MEDICATED_PATCH | Freq: Once | TRANSDERMAL | Status: DC | PRN

## 2015-04-22 MED ORDER — FENTANYL CITRATE (PF) 100 MCG/2ML IJ SOLN
50.0000 ug | INTRAMUSCULAR | Status: DC | PRN
  Administered 2015-04-22: 50 ug via INTRAVENOUS

## 2015-04-22 MED ORDER — ONDANSETRON HCL 4 MG/2ML IJ SOLN
INTRAMUSCULAR | Status: DC | PRN
  Administered 2015-04-22: 4 mg via INTRAVENOUS

## 2015-04-22 MED ORDER — PROPOFOL INFUSION 10 MG/ML OPTIME
INTRAVENOUS | Status: DC | PRN
  Administered 2015-04-22: 75 ug/kg/min via INTRAVENOUS

## 2015-04-22 MED ORDER — MIDAZOLAM HCL 2 MG/2ML IJ SOLN
1.0000 mg | INTRAMUSCULAR | Status: DC | PRN
  Administered 2015-04-22: 2 mg via INTRAVENOUS

## 2015-04-22 SURGICAL SUPPLY — 49 items
APL SKNCLS STERI-STRIP NONHPOA (GAUZE/BANDAGES/DRESSINGS) ×1
BENZOIN TINCTURE PRP APPL 2/3 (GAUZE/BANDAGES/DRESSINGS) ×2 IMPLANT
BLADE MINI RND TIP GREEN BEAV (BLADE) IMPLANT
BLADE SURG 15 STRL LF DISP TIS (BLADE) ×1 IMPLANT
BLADE SURG 15 STRL SS (BLADE) ×3
BNDG CMPR 9X4 STRL LF SNTH (GAUZE/BANDAGES/DRESSINGS) ×1
BNDG COHESIVE 4X5 TAN STRL (GAUZE/BANDAGES/DRESSINGS) ×3 IMPLANT
BNDG ESMARK 4X9 LF (GAUZE/BANDAGES/DRESSINGS) ×2 IMPLANT
BNDG GAUZE ELAST 4 BULKY (GAUZE/BANDAGES/DRESSINGS) ×6 IMPLANT
CHLORAPREP W/TINT 26ML (MISCELLANEOUS) ×3 IMPLANT
CLOSURE WOUND 1/2 X4 (GAUZE/BANDAGES/DRESSINGS) ×1
CORDS BIPOLAR (ELECTRODE) ×2 IMPLANT
COVER BACK TABLE 60X90IN (DRAPES) ×3 IMPLANT
COVER MAYO STAND STRL (DRAPES) ×3 IMPLANT
CUFF TOURNIQUET SINGLE 18IN (TOURNIQUET CUFF) ×2 IMPLANT
DRAPE EXTREMITY T 121X128X90 (DRAPE) ×3 IMPLANT
DRAPE SURG 17X23 STRL (DRAPES) ×3 IMPLANT
DRSG EMULSION OIL 3X3 NADH (GAUZE/BANDAGES/DRESSINGS) ×3 IMPLANT
GLOVE BIO SURGEON STRL SZ7.5 (GLOVE) ×3 IMPLANT
GLOVE BIOGEL PI IND STRL 7.0 (GLOVE) ×2 IMPLANT
GLOVE BIOGEL PI IND STRL 8 (GLOVE) ×1 IMPLANT
GLOVE BIOGEL PI INDICATOR 7.0 (GLOVE) ×4
GLOVE BIOGEL PI INDICATOR 8 (GLOVE) ×2
GLOVE ECLIPSE 6.5 STRL STRAW (GLOVE) ×5 IMPLANT
GLOVE EXAM NITRILE MD LF STRL (GLOVE) ×2 IMPLANT
GOWN STRL REUS W/ TWL LRG LVL3 (GOWN DISPOSABLE) ×2 IMPLANT
GOWN STRL REUS W/TWL LRG LVL3 (GOWN DISPOSABLE) ×6
GOWN STRL REUS W/TWL XL LVL3 (GOWN DISPOSABLE) ×3 IMPLANT
NDL HYPO 25X1 1.5 SAFETY (NEEDLE) IMPLANT
NDL SAFETY ECLIPSE 18X1.5 (NEEDLE) IMPLANT
NEEDLE HYPO 18GX1.5 SHARP (NEEDLE) ×3
NEEDLE HYPO 25X1 1.5 SAFETY (NEEDLE) ×3 IMPLANT
NS IRRIG 1000ML POUR BTL (IV SOLUTION) ×3 IMPLANT
PACK BASIN DAY SURGERY FS (CUSTOM PROCEDURE TRAY) ×3 IMPLANT
PADDING CAST ABS 4INX4YD NS (CAST SUPPLIES) ×2
PADDING CAST ABS COTTON 4X4 ST (CAST SUPPLIES) ×1 IMPLANT
SPLINT PLASTER CAST XFAST 3X15 (CAST SUPPLIES) ×8 IMPLANT
SPLINT PLASTER XTRA FASTSET 3X (CAST SUPPLIES) ×16
SPONGE GAUZE 4X4 12PLY STER LF (GAUZE/BANDAGES/DRESSINGS) ×3 IMPLANT
STOCKINETTE 6  STRL (DRAPES) ×2
STOCKINETTE 6 STRL (DRAPES) ×1 IMPLANT
STRIP CLOSURE SKIN 1/2X4 (GAUZE/BANDAGES/DRESSINGS) ×1 IMPLANT
SUT VICRYL RAPIDE 4-0 (SUTURE) IMPLANT
SUT VICRYL RAPIDE 4/0 PS 2 (SUTURE) ×3 IMPLANT
SYR BULB 3OZ (MISCELLANEOUS) ×2 IMPLANT
SYRINGE 10CC LL (SYRINGE) ×2 IMPLANT
TOWEL OR 17X24 6PK STRL BLUE (TOWEL DISPOSABLE) ×3 IMPLANT
TOWEL OR NON WOVEN STRL DISP B (DISPOSABLE) ×1 IMPLANT
UNDERPAD 30X30 (UNDERPADS AND DIAPERS) ×3 IMPLANT

## 2015-04-22 NOTE — Anesthesia Preprocedure Evaluation (Addendum)
Anesthesia Evaluation  Patient identified by MRN, date of birth, ID band Patient awake    Reviewed: Allergy & Precautions, NPO status , Patient's Chart, lab work & pertinent test results  Airway Mallampati: I  TM Distance: >3 FB Neck ROM: Full    Dental  (+) Teeth Intact, Dental Advisory Given   Pulmonary asthma ,  breath sounds clear to auscultation        Cardiovascular Rhythm:Regular Rate:Normal     Neuro/Psych PSYCHIATRIC DISORDERS Depression    GI/Hepatic   Endo/Other  Morbid obesity  Renal/GU      Musculoskeletal   Abdominal   Peds  Hematology   Anesthesia Other Findings   Reproductive/Obstetrics                            Anesthesia Physical Anesthesia Plan  ASA: III  Anesthesia Plan: MAC   Post-op Pain Management:    Induction: Intravenous  Airway Management Planned: Simple Face Mask  Additional Equipment:   Intra-op Plan:   Post-operative Plan:   Informed Consent: I have reviewed the patients History and Physical, chart, labs and discussed the procedure including the risks, benefits and alternatives for the proposed anesthesia with the patient or authorized representative who has indicated his/her understanding and acceptance.   Dental advisory given  Plan Discussed with: CRNA, Anesthesiologist and Surgeon  Anesthesia Plan Comments:        Anesthesia Quick Evaluation

## 2015-04-22 NOTE — Anesthesia Postprocedure Evaluation (Signed)
  Anesthesia Post-op Note  Patient: Melissa May  Procedure(s) Performed: Procedure(s): LEFT WRIST DEQUERVAIN'S RELEASE (Left)  Patient Location: PACU  Anesthesia Type: MAC   Level of Consciousness: awake, alert  and oriented  Airway and Oxygen Therapy: Patient Spontanous Breathing  Post-op Pain: mild  Post-op Assessment: Post-op Vital signs reviewed  Post-op Vital Signs: Reviewed  Last Vitals:  Filed Vitals:   04/22/15 0835  BP: 100/55  Pulse: 64  Temp: 36.4 C  Resp: 16    Complications: No apparent anesthesia complications

## 2015-04-22 NOTE — Op Note (Signed)
04/22/2015  7:37 AM  PATIENT:  Melissa May  34 y.o. female  PRE-OPERATIVE DIAGNOSIS:  Left wrist deQuervain's tenosynovitis  POST-OPERATIVE DIAGNOSIS:  Same  PROCEDURE:  Left wrist deQuervain's release (1st dorsal compartmentplasty) and tenosynovectomy of the first dorsal compartment  SURGEON: Rayvon Char. Grandville Silos, MD  PHYSICIAN ASSISTANT: Morley Kos, OPA-C  ANESTHESIA:  local and MAC  SPECIMENS:  None  DRAINS:   None  EBL:  less than 50 mL  PREOPERATIVE INDICATIONS:  Melissa May is a  34 y.o. female with refractory left wrist deQuervain's tenosynovitis  The risks benefits and alternatives were discussed with the patient preoperatively including but not limited to the risks of infection, bleeding, nerve injury, cardiopulmonary complications, the need for revision surgery, among others, and the patient verbalized understanding and consented to proceed.  OPERATIVE IMPLANTS: none  OPERATIVE PROCEDURE:  After receiving prophylactic antibiotics, the patient was escorted to the operative theatre and placed in a supine position. She was sedated. A surgical "time-out" was performed during which the planned procedure, proposed operative site, and the correct patient identity were compared to the operative consent and agreement confirmed by the circulating nurse according to current facility policy.  The planned incision was marked and anesthetized with a mixture of lidocaine and Marcaine bearing epinephrine. Following application of a tourniquet to the operative extremity, the exposed skin was prepped with Chloraprep and draped in the usual sterile fashion.  The limb was exsanguinated with an Esmarch bandage and the tourniquet inflated to approximately 162mmHg higher than systolic BP.  An oblique incision was made on the radial aspect of the left wrist overlying the first dorsal compartment. The skin incision was made sharply with a scalpel, subcutaneous tissues were dissected with blunt  and spreading dissection. Crossing cutaneous veins and superficial radial nerve were identified and retracted dorsally for protection, exposing the first dorsal compartment. The compartment was opened in a Z-plasty fashion. The EPB was found to be within it on some sheath and the septum was excised. Tenosynovitis was debrided from within the compartment, both from the floor the compartment and also from amongst the tendons. The tendons were returned to their bed, the wound irrigated, tourniquet released, and additional hemostasis obtained. The compartment was closed and expanded position taking advantage of the Z-plasty. This was done with 4-0 Vicryl Rapide interrupted sutures 2. The skin was then closed with a combination of deep dermal buried 4-0 Vicryl Rapide sutures and a running 4-0 Vicryl Rapide subcuticular suture with benzoin and Steri-Strips. A light dressing with a volar plaster component was applied, and she was taken to the recovery room in stable condition.  DISPOSITION: She'll be discharged home today with typical instructions, including removing the splint dressing, going back into her removable splint, and beginning both tendon stretching and skin mobility exercises, returning in 10-15 days.

## 2015-04-22 NOTE — Discharge Instructions (Addendum)
Discharge Instructions   You have a light dressing on your hand and arm.  You may begin gentle motion of your fingers and hand immediately, but you should not do any heavy lifting or gripping.  Elevate your hand to reduce pain & swelling of the digits.  Ice over the operative site may be helpful to reduce pain & swelling.  DO NOT USE HEAT. Pain medicine has been prescribed for you.  Use your medicine as needed over the first 48 hours, and then you can begin to taper your use. You may use Tylenol in place of your prescribed pain medication, but not IN ADDITION to it. Leave the dressing in place until the third day after your surgery and then remove it, leaving it open to air.  AT THAT TIME, RETURN TO YOUR REMOVEABLE SPLINT, REMOVING IT FOR SLEEP, SHOWER, STRETCHING, AND SKIN MOBILITY EXERCISES  After the bandage has been removed you may shower, regularly washing the incision and letting the water run over it, but not submerging it (no swimming, soaking it in dishwater, etc.) You may drive a car when you are off of prescription pain medications and can safely control your vehicle with both hands. We will address whether therapy will be required or not when you return to the office. You may have already made your follow-up appointment when we completed your preop visit.  If not, please call our office today or the next business day to make your return appointment for 10-15 days after surgery.   Please call [phone] during normal business hours or [phone] after hours for any problems. Including the following:  - excessive redness of the incisions - drainage for more than 4 days - fever of more than 101.5 F  *Please note that pain medications will not be refilled after hours or on weekends.  Post Anesthesia Home Care Instructions  Activity: Get plenty of rest for the remainder of the day. A responsible adult should stay with you for 24 hours following the procedure.  For the next 24  hours, DO NOT: -Drive a car -Paediatric nurse -Drink alcoholic beverages -Take any medication unless instructed by your physician -Make any legal decisions or sign important papers.  Meals: Start with liquid foods such as gelatin or soup. Progress to regular foods as tolerated. Avoid greasy, spicy, heavy foods. If nausea and/or vomiting occur, drink only clear liquids until the nausea and/or vomiting subsides. Call your physician if vomiting continues.  Special Instructions/Symptoms: Your throat may feel dry or sore from the anesthesia or the breathing tube placed in your throat during surgery. If this causes discomfort, gargle with warm salt water. The discomfort should disappear within 24 hours.  If you had a scopolamine patch placed behind your ear for the management of post- operative nausea and/or vomiting:  1. The medication in the patch is effective for 72 hours, after which it should be removed.  Wrap patch in a tissue and discard in the trash. Wash hands thoroughly with soap and water. 2. You may remove the patch earlier than 72 hours if you experience unpleasant side effects which may include dry mouth, dizziness or visual disturbances. 3. Avoid touching the patch. Wash your hands with soap and water after contact with the patch.

## 2015-04-22 NOTE — Transfer of Care (Signed)
Immediate Anesthesia Transfer of Care Note  Patient: Melissa May  Procedure(s) Performed: Procedure(s): LEFT WRIST DEQUERVAIN'S RELEASE (Left)  Patient Location: PACU  Anesthesia Type:MAC  Level of Consciousness: awake, alert , oriented and patient cooperative  Airway & Oxygen Therapy: Patient Spontanous Breathing and Patient connected to face mask oxygen  Post-op Assessment: Report given to RN and Post -op Vital signs reviewed and stable  Post vital signs: Reviewed and stable  Last Vitals:  Filed Vitals:   04/22/15 0706  BP: 131/51  Pulse: 59  Temp: 36.8 C  Resp: 16    Complications: No apparent anesthesia complications

## 2015-04-22 NOTE — Anesthesia Procedure Notes (Signed)
Procedure Name: MAC Date/Time: 04/22/2015 7:35 AM Performed by: Alejandria Wessells D Pre-anesthesia Checklist: Patient identified, Emergency Drugs available, Suction available, Patient being monitored and Timeout performed Patient Re-evaluated:Patient Re-evaluated prior to inductionOxygen Delivery Method: Simple face mask

## 2015-04-22 NOTE — Interval H&P Note (Signed)
History and Physical Interval Note:  04/22/2015 7:37 AM  Melissa May  has presented today for surgery, with the diagnosis of LEFT WRIST DEQUERVAIN'S TENOSYNOVITIS M65.4  The various methods of treatment have been discussed with the patient and family. After consideration of risks, benefits and other options for treatment, the patient has consented to  Procedure(s): LEFT WRIST DEQUERVAIN'S RELEASE (Left) as a surgical intervention .  The patient's history has been reviewed, patient examined, no change in status, stable for surgery.  I have reviewed the patient's chart and labs.  Questions were answered to the patient's satisfaction.     [name] A.

## 2015-04-24 ENCOUNTER — Encounter (HOSPITAL_BASED_OUTPATIENT_CLINIC_OR_DEPARTMENT_OTHER): Payer: Self-pay | Admitting: Orthopedic Surgery

## 2015-06-26 ENCOUNTER — Ambulatory Visit (INDEPENDENT_AMBULATORY_CARE_PROVIDER_SITE_OTHER): Payer: 59 | Admitting: Emergency Medicine

## 2015-06-26 VITALS — BP 110/70 | HR 88 | Temp 98.3°F | Resp 16 | Ht 63.5 in | Wt 237.0 lb

## 2015-06-26 DIAGNOSIS — J0140 Acute pansinusitis, unspecified: Secondary | ICD-10-CM | POA: Diagnosis not present

## 2015-06-26 DIAGNOSIS — J209 Acute bronchitis, unspecified: Secondary | ICD-10-CM

## 2015-06-26 MED ORDER — AMOXICILLIN-POT CLAVULANATE 875-125 MG PO TABS: 1.0000 | ORAL_TABLET | Freq: Two times a day (BID) | ORAL | Status: DC

## 2015-06-26 MED ORDER — PSEUDOEPHEDRINE-GUAIFENESIN ER 60-600 MG PO TB12: 1.0000 | ORAL_TABLET | Freq: Two times a day (BID) | ORAL | Status: DC

## 2015-06-26 MED ORDER — HYDROCOD POLST-CPM POLST ER 10-8 MG/5ML PO SUER: 5.0000 mL | Freq: Two times a day (BID) | ORAL | Status: DC

## 2015-06-26 NOTE — Patient Instructions (Signed)

## 2015-06-26 NOTE — Progress Notes (Signed)
Subjective:  Patient ID: Melissa May, female    DOB: 1981/07/04  Age: 34 y.o. MRN: [id number]  CC: Cough and Nasal Congestion   HPI Melissa May presents  she's been L for several days with the fatigue has nasal congestion postnasal drip and nasal discharge. Purulent color. She has sore throat and pressure in her cheeks and forehead. She has pain in her ears. And a cough productive of purulent sputum. She has no wheezing but does have shortness of breath. 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   She has past surgical history that includes Upper gastrointestinal endoscopy (2009); Wisdom tooth extraction; Strabismus surgery; Cesarean section (N/A, 04/16/2014); Strabismus surgery (Right, 06/22/2014); and Dorsal compartment release (Left, 04/22/2015).   Her  family history includes Allergies in her brother; Asthma in her paternal grandmother; Cancer in her father; Diabetes in her paternal grandmother; Heart disease in her maternal grandfather; Hypertension in her father and paternal grandmother; Kidney cancer in her father; Rheum arthritis in her maternal grandmother and mother; Stroke in her maternal grandmother.  She   reports that she has never smoked. She has never used smokeless tobacco. She reports that she drinks alcohol. She reports that she does not use illicit drugs.  Outpatient Prescriptions Prior to Visit  Medication Sig Dispense Refill  . albuterol (PROVENTIL HFA;VENTOLIN HFA) 108 (90 BASE) MCG/ACT inhaler Inhale into the lungs every 6 (six) hours as needed for wheezing or shortness of breath.    . cetirizine (ZYRTEC) 10 MG tablet Take 10 mg by mouth at bedtime.     . famotidine (PEPCID) 10 MG tablet Take 10 mg by mouth at bedtime.    Marland Kitchen FLUoxetine  (PROZAC) 40 MG capsule Take 60 mg by mouth daily.     Marland Kitchen HYDROcodone-acetaminophen (NORCO) 5-325 MG per tablet Take 1-2 tablets by mouth every 6 (six) hours as needed for moderate pain. 60 tablet 0  . naproxen sodium (ANAPROX) 220 MG tablet Take 440 mg by mouth at bedtime.     No facility-administered medications prior to visit.    Social History   Social History  . Marital Status: Married    Spouse Name: N/A  . Number of Children: 0  . Years of Education: N/A   Occupational History  . Therapist    Social History Main Topics  . Smoking status: Never Smoker   . Smokeless tobacco: Never Used  . Alcohol Use: 0.0 oz/week    0 Standard drinks or equivalent per week     Comment: rarely  . Drug Use: No  . Sexual Activity:    Partners: Male   Other Topics Concern  . None   Social History Narrative   ** Merged History Encounter **         Review of Systems  Constitutional: Positive for fatigue. Negative for fever, chills and appetite change.  HENT: Positive for congestion, ear pain, postnasal drip, rhinorrhea, sinus pressure and sore throat.   Eyes: Negative for pain and redness.  Respiratory: Positive for cough and shortness of breath.   Cardiovascular: Negative for leg swelling.  Gastrointestinal: Negative for nausea, vomiting, abdominal pain, diarrhea, constipation and blood in stool.  Endocrine: Negative for polyuria.  Genitourinary: Negative for dysuria, urgency, frequency and flank pain.  Musculoskeletal: Negative for gait problem.  Skin: Negative for  rash.  Neurological: Negative for weakness and headaches.  Psychiatric/Behavioral: Negative for confusion and decreased concentration. The patient is not nervous/anxious.     Objective:  BP 110/70 mmHg  Pulse 88  Temp(Src) 98.3 F (36.8 C) (Oral)  Resp 16  Ht 5' 3.5" (1.613 m)  Wt 237 lb (107.502 kg)  BMI 41.32 kg/m2  SpO2 97%  Physical Exam  Constitutional: She is oriented to person, place, and time. She  appears well-developed and well-nourished. No distress.  HENT:  Head: Normocephalic and atraumatic.  Right Ear: External ear normal.  Left Ear: External ear normal.  Nose: Nose normal.  Eyes: Conjunctivae and EOM are normal. Pupils are equal, round, and reactive to light. No scleral icterus.  Neck: Normal range of motion. Neck supple. No tracheal deviation present.  Cardiovascular: Normal rate, regular rhythm and normal heart sounds.   Pulmonary/Chest: Effort normal. No respiratory distress. She has no wheezes. She has no rales.  Abdominal: She exhibits no mass. There is no tenderness. There is no rebound and no guarding.  Musculoskeletal: She exhibits no edema.  Lymphadenopathy:    She has no cervical adenopathy.  Neurological: She is alert and oriented to person, place, and time. Coordination normal.  Skin: Skin is warm and dry. No rash noted.  Psychiatric: She has a normal mood and affect. Her behavior is normal.      Assessment & Plan:   Melissa May was seen today for cough and nasal congestion.  Diagnoses and all orders for this visit:  Acute bronchitis, unspecified organism  Acute pansinusitis, recurrence not specified  Other orders -     amoxicillin-clavulanate (AUGMENTIN) 875-125 MG tablet; Take 1 tablet by mouth 2 (two) times daily. -     pseudoephedrine-guaifenesin (MUCINEX D) 60-600 MG 12 hr tablet; Take 1 tablet by mouth every 12 (twelve) hours. -     chlorpheniramine-HYDROcodone (TUSSIONEX PENNKINETIC ER) 10-8 MG/5ML SUER; Take 5 mLs by mouth 2 (two) times daily.  I have discontinued Ms. Symons naproxen sodium and HYDROcodone-acetaminophen. I am also having her start on amoxicillin-clavulanate, pseudoephedrine-guaifenesin, and chlorpheniramine-HYDROcodone. Additionally, I am having her maintain her cetirizine, FLUoxetine, famotidine, and albuterol.  Meds ordered this encounter  Medications  . amoxicillin-clavulanate (AUGMENTIN) 875-125 MG tablet    Sig: Take 1 tablet  by mouth 2 (two) times daily.    Dispense:  20 tablet    Refill:  0  . pseudoephedrine-guaifenesin (MUCINEX D) 60-600 MG 12 hr tablet    Sig: Take 1 tablet by mouth every 12 (twelve) hours.    Dispense:  18 tablet    Refill:  0  . chlorpheniramine-HYDROcodone (TUSSIONEX PENNKINETIC ER) 10-8 MG/5ML SUER    Sig: Take 5 mLs by mouth 2 (two) times daily.    Dispense:  60 mL    Refill:  0    Appropriate red flag conditions were discussed with the patient as well as actions that should be taken.  Patient expressed his understanding.  Follow-up: Return if symptoms worsen or fail to improve.  Roselee Culver, MD

## 2015-09-30 MED FILL — FLUoxetine HCL 40 MG CAPS: 40 | 90 days supply | Qty: 180 | Fill #1 | Status: TO

## 2015-10-06 ENCOUNTER — Ambulatory Visit (INDEPENDENT_AMBULATORY_CARE_PROVIDER_SITE_OTHER): Payer: 59 | Admitting: Physician Assistant

## 2015-10-06 VITALS — BP 112/68 | HR 101 | Temp 98.5°F | Resp 16 | Ht 64.0 in | Wt 242.6 lb

## 2015-10-06 DIAGNOSIS — J329 Chronic sinusitis, unspecified: Secondary | ICD-10-CM | POA: Diagnosis not present

## 2015-10-06 DIAGNOSIS — J309 Allergic rhinitis, unspecified: Secondary | ICD-10-CM | POA: Insufficient documentation

## 2015-10-06 MED ORDER — IPRATROPIUM BROMIDE 0.03 % NA SOLN: 2.0000 | Freq: Two times a day (BID) | NASAL | Status: DC

## 2015-10-06 MED ORDER — AMOXICILLIN-POT CLAVULANATE 875-125 MG PO TABS: 1.0000 | ORAL_TABLET | Freq: Two times a day (BID) | ORAL | Status: AC

## 2015-10-06 MED ORDER — MONTELUKAST SODIUM 10 MG PO TABS: 10.0000 mg | ORAL_TABLET | Freq: Every day | ORAL | Status: DC

## 2015-10-06 NOTE — Progress Notes (Signed)
[facility] [address] [place] [address] [phone]  Date:  10/06/2015   Name:  Melissa May   DOB:  23-May-1981   MRN:  [id number]  PCP:  Helen Hashimoto., MD    Chief Complaint: Ear Fullness; Cough; and Sinusitis   History of Present Illness:  This is a 35 y.o. female with PMH mild int asthma, depression who is presenting with 4 days of nasal congestion, cough. Having facial pain. She is having lightheadedness. Cough is mix of productive and dry. Some sob with exertion. No wheezing. No otalgia. +sore throat. No fever or chills.  Aggravating/alleviating factors: nyquil and mucinex and afrin. Afrin is the only thing that helps. History of asthma: yes. At baseline uses albuterol every couple months. Has been using 2-3 times a day since getting sick History of env allergies: yes, takes zyrtec. Has tried flonase before but didn't help. Hasn't tried anything else for her allergies. Tobacco use: no  Pt gets 6 episodes of sinusitis per year. States last episode 2 months ago. She has been referred to ENT in the past but never followed through as she changed insurances. Review of Systems:  Review of Systems See HPI  Patient Active Problem List   Diagnosis Date Noted  . Extrinsic asthma 11/11/2014  . H/O: depression 04/19/2014  . Cesarean delivery delivered 04/16/2014  . Hypermenorrhea 12/21/2012  . Cough 09/17/2011    Prior to Admission medications   Medication Sig Start Date End Date Taking? Authorizing Provider  albuterol (PROVENTIL HFA;VENTOLIN HFA) 108 (90 BASE) MCG/ACT inhaler Inhale into the lungs every 6 (six) hours as needed for wheezing or shortness of breath.   Yes Historical Provider, MD  famotidine (PEPCID) 10 MG tablet Take 10 mg by mouth at bedtime.   Yes Historical Provider, MD  FLUoxetine (PROZAC) 40 MG capsule Take 60 mg by mouth daily.    Yes Historical Provider, MD  cetirizine (ZYRTEC) 10 MG tablet Take 10 mg by mouth at bedtime.  Reported on 10/06/2015   yes Historical Provider, MD    Allergies  Allergen Reactions  . Lexapro [Escitalopram Oxalate] Nausea And Vomiting  . Adhesive [Tape] Rash  . Morphine And Related Itching    Past Surgical History  Procedure Laterality Date  . Upper gastrointestinal endoscopy  2009  . Wisdom tooth extraction    . Strabismus surgery      as an infant  . Cesarean section N/A 04/16/2014    Procedure: CESAREAN SECTION;  Surgeon: Elveria Royals, MD;  Location: [facility];  Service: Obstetrics;  Laterality: N/A;  . Strabismus surgery Right 06/22/2014    Procedure: REPAIR STRABISMUS RIGHT EYE;  Surgeon: Derry Skill, MD;  Location: [facility];  Service: Ophthalmology;  Laterality: Right;  . Dorsal compartment release Left 04/22/2015    Procedure: LEFT WRIST DEQUERVAIN'S RELEASE;  Surgeon: Milly Jakob, MD;  Location: [facility];  Service: Orthopedics;  Laterality: Left;    Social History  Substance Use Topics  . Smoking status: Never Smoker   . Smokeless tobacco: Never Used  . Alcohol Use: 0.0 oz/week    0 Standard drinks or equivalent per week     Comment: rarely    Family History  Problem Relation Age of Onset  . Cancer Father   . Kidney cancer Father   . Hypertension Father   . Heart disease Maternal Grandfather   . Rheum arthritis Mother   . Asthma Paternal Grandmother   .  Diabetes Paternal Grandmother   . Hypertension Paternal Grandmother   . Allergies Brother   . Rheum arthritis Maternal Grandmother   . Stroke Maternal Grandmother     Medication list has been reviewed and updated.  Physical Examination:  Physical Exam  Constitutional: She is oriented to person, place, and time. She appears well-developed and well-nourished. No distress.  HENT:  Head: Normocephalic and atraumatic.  Right Ear: Hearing, external ear and ear canal normal. A middle ear effusion is present.  Left Ear: Hearing, external ear and ear canal normal.  Tympanic membrane is retracted.  Nose: Right sinus exhibits no frontal sinus tenderness. Left sinus exhibits no frontal sinus tenderness.  Mouth/Throat: Uvula is midline and mucous membranes are normal. Posterior oropharyngeal erythema present. No oropharyngeal exudate or posterior oropharyngeal edema.  Significant bilateral maxillary tenderness  Eyes: Conjunctivae and lids are normal. Right eye exhibits no discharge. Left eye exhibits no discharge. No scleral icterus.  Cardiovascular: Normal rate, regular rhythm, normal heart sounds, intact distal pulses and normal pulses.   No murmur heard. Pulmonary/Chest: Effort normal and breath sounds normal. No respiratory distress. She has no decreased breath sounds. She has no wheezes. She has no rhonchi. She has no rales.  Musculoskeletal: Normal range of motion.  Lymphadenopathy:       Head (right side): No submental, no submandibular and no tonsillar adenopathy present.       Head (left side): No submental, no submandibular and no tonsillar adenopathy present.    She has no cervical adenopathy.  Neurological: She is alert and oriented to person, place, and time.  Skin: Skin is warm, dry and intact. No lesion and no rash noted.  Psychiatric: She has a normal mood and affect. Her speech is normal and behavior is normal. Thought content normal.    BP 112/68 mmHg  Pulse 101  Temp(Src) 98.5 F (36.9 C) (Oral)  Resp 16  Ht 5\' 4"  (1.626 m)  Wt 242 lb 9.6 oz (110.043 kg)  BMI 41.62 kg/m2  SpO2 98%  Assessment and Plan:  1. Allergic rhinitis, unspecified allergic rhinitis type 2. Recurrent sinusitis Only 4 days of illness, however significant maxillary tenderness and dizziness. Will treat with augmentin and atrovent. Referred back to ENT for eval recurrent sinusitis. Added singulair to allergy regimen since recurrent sinusitis could be caused by uncontrolled allergies. Return in 10-14 days if symptoms do not improve or at any time if symptoms  worsen.  - montelukast (SINGULAIR) 10 MG tablet; Take 1 tablet (10 mg total) by mouth at bedtime.  Dispense: 30 tablet; Refill: 3 - Ambulatory referral to ENT - amoxicillin-clavulanate (AUGMENTIN) 875-125 MG tablet; Take 1 tablet by mouth 2 (two) times daily.  Dispense: 20 tablet; Refill: 0 - Ambulatory referral to ENT - ipratropium (ATROVENT) 0.03 % nasal spray; Place 2 sprays into both nostrils 2 (two) times daily.  Dispense: 30 mL; Refill: 0   Nicole V. Drenda Freeze, MHS [facility]  10/06/2015

## 2015-10-06 NOTE — Patient Instructions (Addendum)
Keep taking zyrtec at night. STart taking singulair at night as well. Stop afrin after today. Then you can start atrovent nasal spray twice a day. Continue the albuterol as needed for shortness of breath. You will get a phone call to make appt with ENT.

## 2015-10-07 DIAGNOSIS — J329 Chronic sinusitis, unspecified: Secondary | ICD-10-CM | POA: Diagnosis not present

## 2015-10-07 DIAGNOSIS — J9801 Acute bronchospasm: Secondary | ICD-10-CM | POA: Diagnosis not present

## 2015-10-07 DIAGNOSIS — J45909 Unspecified asthma, uncomplicated: Secondary | ICD-10-CM | POA: Diagnosis not present

## 2015-10-07 MED FILL — GUAIATUSSIN AC LIQUID: 100-10 | 4 days supply | Qty: 120 | Fill #0

## 2015-10-17 DIAGNOSIS — H5213 Myopia, bilateral: Secondary | ICD-10-CM | POA: Diagnosis not present

## 2016-01-13 ENCOUNTER — Other Ambulatory Visit (HOSPITAL_COMMUNITY): Payer: Self-pay | Admitting: Internal Medicine

## 2016-01-13 DIAGNOSIS — R109 Unspecified abdominal pain: Secondary | ICD-10-CM

## 2016-01-13 DIAGNOSIS — R112 Nausea with vomiting, unspecified: Secondary | ICD-10-CM

## 2016-01-14 ENCOUNTER — Ambulatory Visit (HOSPITAL_COMMUNITY)
Admission: RE | Admit: 2016-01-14 | Discharge: 2016-01-14 | Disposition: A | Discharge status: Home / Self Care | Payer: PRIVATE HEALTH INSURANCE | Source: Ambulatory Visit | Attending: Internal Medicine | Admitting: Internal Medicine

## 2016-01-14 ENCOUNTER — Other Ambulatory Visit: Payer: Self-pay | Admitting: Internal Medicine

## 2016-01-14 DIAGNOSIS — R109 Unspecified abdominal pain: Secondary | ICD-10-CM | POA: Diagnosis present

## 2016-01-14 DIAGNOSIS — K760 Fatty (change of) liver, not elsewhere classified: Secondary | ICD-10-CM | POA: Insufficient documentation

## 2016-01-14 DIAGNOSIS — R112 Nausea with vomiting, unspecified: Secondary | ICD-10-CM

## 2016-01-14 DIAGNOSIS — K769 Liver disease, unspecified: Secondary | ICD-10-CM | POA: Diagnosis not present

## 2016-01-20 ENCOUNTER — Telehealth: Payer: Self-pay | Admitting: Hematology

## 2016-01-20 ENCOUNTER — Encounter: Payer: Self-pay | Admitting: Hematology

## 2016-01-20 NOTE — Telephone Encounter (Signed)
Verified insurance and address, contact referring provide appt date/time, new pt packet mailed out

## 2016-01-22 ENCOUNTER — Telehealth: Payer: Self-pay | Admitting: Obstetrics & Gynecology

## 2016-01-22 ENCOUNTER — Ambulatory Visit
Admission: RE | Admit: 2016-01-22 | Discharge: 2016-01-22 | Disposition: A | Discharge status: Home / Self Care | Payer: PRIVATE HEALTH INSURANCE | Source: Ambulatory Visit | Attending: Internal Medicine | Admitting: Internal Medicine

## 2016-01-22 DIAGNOSIS — Z30432 Encounter for removal of intrauterine contraceptive device: Secondary | ICD-10-CM

## 2016-01-22 DIAGNOSIS — K769 Liver disease, unspecified: Secondary | ICD-10-CM

## 2016-01-22 MED ORDER — GADOXETATE DISODIUM 0.25 MMOL/ML IV SOLN
10.0000 mL | Freq: Once | INTRAVENOUS | Status: AC | PRN
  Administered 2016-01-22: 10 mL via INTRAVENOUS

## 2016-01-22 NOTE — Telephone Encounter (Signed)
Patient wants to schedule an appointment to have her IUD removed.

## 2016-01-22 NOTE — Telephone Encounter (Signed)
Left message to call Kaitlyn at [phone]. 

## 2016-01-23 NOTE — Telephone Encounter (Signed)
Patient returned call

## 2016-01-23 NOTE — Telephone Encounter (Signed)
Return call to patient. She is requesting an appointment for IUD removal. She has undergone a work up with GI and her PCP and is having ongoing nausea and leukocytosis. Has appointment with medical oncology for leukocytosis. She was evaluated by GI and found to have a liver lesion on ultrasound and completed MRI this morning but has not received results. Home pregnancy testing 3 weeks ago was negative. Patient reports intermittent vaginal bleeding. Denies abdominal pain. Nausea is worse symptom for her at this time.   She is thinking she may want to go back on the Nuva Ring after IUD is removed. But is unsure. She is sure she wants to have IUD out and declines office visit to discuss prior to procedure.  Patient requests 02/07/16 for appointment for IUD removal.  Advised patient will send message to Dr. Sabra Heck to review and will call back with any instructions.  If no additional instructions from Dr. Sabra Heck, she will be called with insurance benefits.  Advised to call back with any concerns prior to appointment or with heavy bleeding or pain. Patient agreeable.

## 2016-01-30 ENCOUNTER — Telehealth: Payer: Self-pay | Admitting: Hematology

## 2016-01-30 ENCOUNTER — Ambulatory Visit: Payer: 59 | Admitting: Hematology

## 2016-01-30 NOTE — Telephone Encounter (Signed)
pt called to cx appt...she will call back to r/s  °

## 2016-02-07 ENCOUNTER — Ambulatory Visit (INDEPENDENT_AMBULATORY_CARE_PROVIDER_SITE_OTHER): Payer: PRIVATE HEALTH INSURANCE | Admitting: Obstetrics & Gynecology

## 2016-02-07 VITALS — BP 110/70 | HR 102 | Resp 18 | Wt 242.8 lb

## 2016-02-07 DIAGNOSIS — Z3009 Encounter for other general counseling and advice on contraception: Secondary | ICD-10-CM

## 2016-02-07 DIAGNOSIS — D134 Benign neoplasm of liver: Secondary | ICD-10-CM | POA: Diagnosis not present

## 2016-02-07 DIAGNOSIS — Z30432 Encounter for removal of intrauterine contraceptive device: Secondary | ICD-10-CM

## 2016-02-07 NOTE — Progress Notes (Signed)
35 y.o. G85P1001 Married Caucasian female presents for removal of Mirena IUD and to discuss birth control options.  Pt recently diagnosed with liver adenoma, likely benign.  Pt has experienced a fair amount of spotting with her IUD so wants removal for this reason and because she thinks thinks this may contribute to the size and growth of the liver adenoma.  Pt would like to restart her Nuva ring for contraception.    Reviewed with pt liver finding and recommendation of not using ANY estrogen products due to this finding and chance that estrogen can increase growth of adenomas.  Shared with pt the CDC recommendations of estrogen containing options being grade 4 (not recommended) in contraception options as well as all progesterone options being grade 3 (risks generally thought to outweigh benefits).  Pt voices clear understanding of not being able to use hormonal contraception.  She and spouse do want another child, just not yet, but if it happened that would be ok.  Copper IUD, condom use, withdrawal method, and natural family planning with monitoring for ovulation discussed.  Pt would prefer to use condoms.  She is also fairly sure after the next pregnancy, she will just have a repeat Cesarean section and have her tubes tied at the same time.   She does understand limited hormonal use is possible so if she skips cycles for more than 90 days, she will call.  Pt has hx of PCOS so aware this is possible.  Voices understanding.  Questions answered.    LMP:  Patient's last menstrual period was 01/24/2016 (approximate).  Patient Active Problem List   Diagnosis Date Noted  . Rhinitis, allergic 10/06/2015  . Recurrent sinusitis 10/06/2015  . Extrinsic asthma 11/11/2014  . H/O: depression 04/19/2014  . Cesarean delivery delivered 04/16/2014  . Hypermenorrhea 12/21/2012  . Cough 09/17/2011   Past Medical History  Diagnosis Date  . GERD (gastroesophageal reflux disease)   . Depression   . Asthma    prn inhaler  . TMJ syndrome   . 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 Obese   . De Quervain's tenosynovitis, left 04/2015   Current Outpatient Prescriptions on File Prior to Visit  Medication Sig Dispense Refill  . albuterol (PROVENTIL HFA;VENTOLIN HFA) 108 (90 BASE) MCG/ACT inhaler Inhale into the lungs every 6 (six) hours as needed for wheezing or shortness of breath.    . cetirizine (ZYRTEC) 10 MG tablet Take 10 mg by mouth at bedtime. Reported on 10/06/2015    . FLUoxetine (PROZAC) 40 MG capsule Take 80 mg by mouth daily.      No current facility-administered medications on file prior to visit.   Lexapro; Adhesive; and Morphine and related  Review of Systems  All other systems reviewed and are negative.  Filed Vitals:   02/07/16 1612  BP: 110/70  Pulse: 102  Resp: 18  Weight: 242 lb 12.8 oz (110.133 kg)    Gen:  WNWF healthy female NAD Abdomen: soft, non-tender Groin:  no inguinal nodes palpated  Pelvic exam: Vulva:  normal female genitalia Vagina:  normal vagina Cervix:  Non-tender, Negative CMT, no lesions or redness. Uterus:  normal shape, position and consistency   Procedure:  Speculum reinserted.  Cervix visualized and cleansed with Betadine x 3.  IUD string noted and grasped with ringed forceps.  IUD removed with one pull.  Pt, RN Karmen Bongo, and I all visualized IUD before discarding.   A: Removal of Mirena IUD Contraception desires which are limited in  this pt due to liver adenoma H/O PCOS  P:  Return for AEX Pt has MRI of liver scheduled for six months from exam done 5/17 For now, pt will use condoms for contraception and let me know if desires to proceed with Copper IUD  ~15 minutes spent with patient >50% of time was in face to face discussion of above before procedure was performed.

## 2016-02-08 DIAGNOSIS — D134 Benign neoplasm of liver: Secondary | ICD-10-CM | POA: Insufficient documentation

## 2016-07-29 ENCOUNTER — Other Ambulatory Visit: Payer: Self-pay | Admitting: Internal Medicine

## 2016-07-29 DIAGNOSIS — K769 Liver disease, unspecified: Secondary | ICD-10-CM

## 2016-08-15 ENCOUNTER — Ambulatory Visit
Admission: RE | Admit: 2016-08-15 | Discharge: 2016-08-15 | Disposition: A | Discharge status: Home / Self Care | Payer: PRIVATE HEALTH INSURANCE | Source: Ambulatory Visit | Attending: Internal Medicine | Admitting: Internal Medicine

## 2016-08-15 DIAGNOSIS — K769 Liver disease, unspecified: Secondary | ICD-10-CM

## 2016-08-15 MED ORDER — GADOXETATE DISODIUM 0.25 MMOL/ML IV SOLN
10.0000 mL | Freq: Once | INTRAVENOUS | Status: AC | PRN
  Administered 2016-08-15: 10 mL via INTRAVENOUS

## 2017-04-15 ENCOUNTER — Other Ambulatory Visit (HOSPITAL_COMMUNITY)
Admission: RE | Admit: 2017-04-15 | Discharge: 2017-04-15 | Disposition: A | Discharge status: Home / Self Care | Payer: PRIVATE HEALTH INSURANCE | Source: Ambulatory Visit | Attending: Obstetrics & Gynecology | Admitting: Obstetrics & Gynecology

## 2017-04-15 ENCOUNTER — Ambulatory Visit (INDEPENDENT_AMBULATORY_CARE_PROVIDER_SITE_OTHER): Payer: PRIVATE HEALTH INSURANCE | Admitting: Obstetrics & Gynecology

## 2017-04-15 ENCOUNTER — Encounter: Payer: Self-pay | Admitting: Obstetrics & Gynecology

## 2017-04-15 VITALS — BP 108/70 | HR 72 | Resp 16 | Ht 63.0 in | Wt 250.0 lb

## 2017-04-15 DIAGNOSIS — Z01419 Encounter for gynecological examination (general) (routine) without abnormal findings: Secondary | ICD-10-CM | POA: Diagnosis not present

## 2017-04-15 DIAGNOSIS — Z124 Encounter for screening for malignant neoplasm of cervix: Secondary | ICD-10-CM | POA: Insufficient documentation

## 2017-04-15 NOTE — Progress Notes (Signed)
36 y.o. G1P1001 MarriedCaucasianF here for annual exam.  Doing well.  Cycles are basically regular.  Will occasionally come a little earlier or later.  Flow lasts about six days.  Desires no contraception.    Had six sinus infections in six months.  Had allergy testing and is now on Singulair.     Patient's last menstrual period was 04/03/2017.          Sexually active: Yes.    The current method of family planning is none.    Exercising: No.  The patient does not participate in regular exercise at present. Smoker:  no  Health Maintenance: Pap:  October 2015 per patient with Dr. Benjie Karvonen - normal History of abnormal Pap:  no MMG:  n/a Colonoscopy:  n/a BMD:   n/a TDaP:  Around 2013.  Done before she got pregnant. Pneumonia vaccine(s):  n/a Zostavax:   n/a Hep C testing: not indicated due to age Screening Labs: PCP takes care of labs   reports that she has never smoked. She has never used smokeless tobacco. She reports that she drinks alcohol. She reports that she does not use drugs.  Past Medical History:  Diagnosis Date  . Asthma    prn inhaler  . De Quervain's tenosynovitis, left 04/2015  . Depression   . 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 GERD (gastroesophageal reflux disease)   . Obese   . TMJ syndrome     Past Surgical History:  Procedure Laterality Date  . CESAREAN SECTION N/A 04/16/2014   Procedure: CESAREAN SECTION;  Surgeon: Elveria Royals, MD;  Location: [facility];  Service: Obstetrics;  Laterality: N/A;  . DORSAL COMPARTMENT RELEASE Left 04/22/2015   Procedure: LEFT WRIST DEQUERVAIN'S RELEASE;  Surgeon: Milly Jakob, MD;  Location: [facility];  Service: Orthopedics;  Laterality: Left;  . STRABISMUS SURGERY     as an infant  . STRABISMUS SURGERY Right 06/22/2014   Procedure: REPAIR STRABISMUS RIGHT EYE;  Surgeon: Derry Skill, MD;  Location: [facility];  Service: Ophthalmology;  Laterality: Right;  . UPPER GASTROINTESTINAL  ENDOSCOPY  2009  . WISDOM TOOTH EXTRACTION      Current Outpatient Prescriptions  Medication Sig Dispense Refill  . albuterol (PROVENTIL HFA;VENTOLIN HFA) 108 (90 BASE) MCG/ACT inhaler Inhale into the lungs every 6 (six) hours as needed for wheezing or shortness of breath.    . cetirizine (ZYRTEC) 10 MG tablet Take 10 mg by mouth at bedtime. Reported on 10/06/2015    . FLUoxetine (PROZAC) 40 MG capsule Take 80 mg by mouth daily.     . montelukast (SINGULAIR) 10 MG tablet Take 10 mg by mouth at bedtime.    Marland Kitchen omeprazole (PRILOSEC) 10 MG capsule Take 10 mg by mouth daily.     No current facility-administered medications for this visit.     Family History  Problem Relation Age of Onset  . Cancer Father   . Kidney cancer Father   . Hypertension Father   . Heart disease Maternal Grandfather   . Rheum arthritis Mother   . Asthma Paternal Grandmother   . Diabetes Paternal Grandmother   . Hypertension Paternal Grandmother   . Allergies Brother   . Rheum arthritis Maternal Grandmother   . Stroke Maternal Grandmother     ROS:  Pertinent items are noted in HPI.  Otherwise, a comprehensive ROS was negative.  Exam:   BP 108/70 (BP Location: Right Arm, Patient Position: Sitting, Cuff Size: Normal)  Pulse 72   Resp 16   Ht 5\' 3"  (1.6 m)   Wt 250 lb (113.4 kg)   LMP 04/03/2017   BMI 44.29 kg/m   Weight change: +18#  Height: 5\' 3"  (160 cm)  Ht Readings from Last 3 Encounters:  04/15/17 5\' 3"  (1.6 m)  10/06/15 5\' 4"  (1.626 m)  06/26/15 5' 3.5" (1.613 m)    General appearance: alert, cooperative and appears stated age Head: Normocephalic, without obvious abnormality, atraumatic Neck: no adenopathy, supple, symmetrical, trachea midline and thyroid normal to inspection and palpation Lungs: clear to auscultation bilaterally Breasts: normal appearance, no masses or tenderness Heart: regular rate and rhythm Abdomen: soft, non-tender; bowel sounds normal; no masses,  no  organomegaly Extremities: extremities normal, atraumatic, no cyanosis or edema Skin: Skin color, texture, turgor normal. No rashes or lesions Lymph nodes: Cervical, supraclavicular, and axillary nodes normal. No abnormal inguinal nodes palpated Neurologic: Grossly normal   Pelvic: External genitalia:  no lesions              Urethra:  normal appearing urethra with no masses, tenderness or lesions              Bartholins and Skenes: normal                 Vagina: normal appearing vagina with normal color and discharge, no lesions              Cervix: no lesions              Pap taken: Yes.   Bimanual Exam:  Uterus:  normal size, contour, position, consistency, mobility, non-tender              Adnexa: normal adnexa and no mass, fullness, tenderness               Rectovaginal: Confirms               Anus:  normal sphincter tone, no lesions  Chaperone was present for exam.  A:  Well Woman with normal exam 2.6cm benign liver adenoma, had MRI follow-up 12/17.  No additional follow-up needed. Asthma and allergies  P:   Mammogram guidelines reviewed pap smear and HR HPV obtained today Had blood work last week at PCP.  Has appt to review this today with PCP Return annually or prn

## 2017-04-19 LAB — CYTOLOGY - PAP
Diagnosis: NEGATIVE
HPV (WINDOPATH): NOT DETECTED

## 2017-04-20 ENCOUNTER — Telehealth: Payer: Self-pay | Admitting: *Deleted

## 2017-04-20 MED ORDER — FLUCONAZOLE 150 MG PO TABS: ORAL_TABLET | ORAL | 0 refills | Status: DC

## 2017-04-20 NOTE — Telephone Encounter (Signed)
Call to patient. Message given to patient as seen below from Dr. Sabra Heck. Patient verbalized understanding. Patient requests diflucan be sent to [facility] in [place] on [address]. Prescription for diflucan 150mg  #2, 0RF sent to [facility].   Patient agreeable to disposition. Will close encounter.

## 2017-04-20 NOTE — Telephone Encounter (Signed)
-----   Message from Megan Salon, MD sent at 04/19/2017  1:56 PM EDT ----- 02 recall.  Please let pt know her pap and HR HPV were negative.  Yeast noted on pap smear.  Ok to treat with Terazol 7 nightly x 7 days or Diflucan 150mg  po x 1, repeat 72 hours.  #2/0RF.  Ok to call in whatever she desires.

## 2017-04-28 ENCOUNTER — Encounter: Payer: Self-pay | Admitting: Oncology

## 2017-04-28 ENCOUNTER — Telehealth: Payer: Self-pay | Admitting: Oncology

## 2017-04-28 NOTE — Telephone Encounter (Signed)
Appt has been scheduled with Fraser Din from [facility] for the pt to see Dr. Alen Blew on 9/11 at 2pm. She will notify the pt. Letter mailed.

## 2017-05-11 ENCOUNTER — Encounter: Payer: PRIVATE HEALTH INSURANCE | Admitting: Oncology

## 2017-05-31 ENCOUNTER — Emergency Department (HOSPITAL_COMMUNITY): Payer: PRIVATE HEALTH INSURANCE

## 2017-05-31 ENCOUNTER — Emergency Department (HOSPITAL_COMMUNITY)
Admission: EM | Admit: 2017-05-31 | Discharge: 2017-06-01 | Disposition: A | Discharge status: Home / Self Care | Payer: PRIVATE HEALTH INSURANCE | Attending: Emergency Medicine | Admitting: Emergency Medicine

## 2017-05-31 ENCOUNTER — Encounter (HOSPITAL_COMMUNITY): Payer: Self-pay | Admitting: Emergency Medicine

## 2017-05-31 DIAGNOSIS — R51 Headache: Secondary | ICD-10-CM | POA: Diagnosis not present

## 2017-05-31 DIAGNOSIS — J45909 Unspecified asthma, uncomplicated: Secondary | ICD-10-CM | POA: Diagnosis not present

## 2017-05-31 DIAGNOSIS — Z79899 Other long term (current) drug therapy: Secondary | ICD-10-CM | POA: Diagnosis not present

## 2017-05-31 DIAGNOSIS — D134 Benign neoplasm of liver: Secondary | ICD-10-CM | POA: Insufficient documentation

## 2017-05-31 DIAGNOSIS — R112 Nausea with vomiting, unspecified: Secondary | ICD-10-CM | POA: Diagnosis not present

## 2017-05-31 DIAGNOSIS — R1030 Lower abdominal pain, unspecified: Secondary | ICD-10-CM | POA: Insufficient documentation

## 2017-05-31 DIAGNOSIS — R0789 Other chest pain: Secondary | ICD-10-CM | POA: Insufficient documentation

## 2017-05-31 DIAGNOSIS — R197 Diarrhea, unspecified: Secondary | ICD-10-CM | POA: Diagnosis not present

## 2017-05-31 LAB — URINALYSIS, ROUTINE W REFLEX MICROSCOPIC
Glucose, UA: NEGATIVE mg/dL
Hgb urine dipstick: NEGATIVE
KETONES UR: NEGATIVE mg/dL
LEUKOCYTES UA: NEGATIVE
NITRITE: NEGATIVE
PROTEIN: NEGATIVE mg/dL
Specific Gravity, Urine: >1.03 — ABNORMAL HIGH (ref 1.005–1.030)
pH: 6 (ref 5.0–8.0)

## 2017-05-31 LAB — CBC
HCT: 40.6 % (ref 36.0–46.0)
Hemoglobin: 13.4 g/dL (ref 12.0–15.0)
MCH: 29.1 pg (ref 26.0–34.0)
MCHC: 33 g/dL (ref 30.0–36.0)
MCV: 88.1 fL (ref 78.0–100.0)
PLATELETS: 418 10*3/uL — AB (ref 150–400)
RBC: 4.61 MIL/uL (ref 3.87–5.11)
RDW: 13.3 % (ref 11.5–15.5)
WBC: 17.6 10*3/uL — AB (ref 4.0–10.5)

## 2017-05-31 LAB — URINALYSIS, MICROSCOPIC (REFLEX): RBC / HPF: NONE SEEN RBC/hpf (ref 0–5)

## 2017-05-31 LAB — WET PREP, GENITAL
Clue Cells Wet Prep HPF POC: NONE SEEN
Sperm: NONE SEEN
Trich, Wet Prep: NONE SEEN
Yeast Wet Prep HPF POC: NONE SEEN

## 2017-05-31 LAB — LIPASE, BLOOD: Lipase: 21 U/L (ref 11–51)

## 2017-05-31 LAB — COMPREHENSIVE METABOLIC PANEL
ALT: 18 U/L (ref 14–54)
AST: 21 U/L (ref 15–41)
Albumin: 3.6 g/dL (ref 3.5–5.0)
Alkaline Phosphatase: 100 U/L (ref 38–126)
Anion gap: 11 (ref 5–15)
BILIRUBIN TOTAL: 0.6 mg/dL (ref 0.3–1.2)
BUN: 6 mg/dL (ref 6–20)
CO2: 24 mmol/L (ref 22–32)
Calcium: 8.7 mg/dL — ABNORMAL LOW (ref 8.9–10.3)
Chloride: 101 mmol/L (ref 101–111)
Creatinine, Ser: 0.66 mg/dL (ref 0.44–1.00)
GFR calc Af Amer: >60 mL/min (ref >60)
Glucose, Bld: 123 mg/dL — ABNORMAL HIGH (ref 65–99)
POTASSIUM: 3.7 mmol/L (ref 3.5–5.1)
Sodium: 136 mmol/L (ref 135–145)
TOTAL PROTEIN: 7.6 g/dL (ref 6.5–8.1)

## 2017-05-31 LAB — PREGNANCY, URINE: Preg Test, Ur: NEGATIVE

## 2017-05-31 MED ORDER — METRONIDAZOLE 500 MG PO TABS
500.0000 mg | ORAL_TABLET | Freq: Once | ORAL | Status: AC
  Administered 2017-05-31: 500 mg via ORAL
  Filled 2017-05-31: qty 1

## 2017-05-31 MED ORDER — SODIUM CHLORIDE 0.9 % IV BOLUS (SEPSIS)
1000.0000 mL | Freq: Once | INTRAVENOUS | Status: AC
  Administered 2017-05-31: 1000 mL via INTRAVENOUS

## 2017-05-31 MED ORDER — ACETAMINOPHEN 500 MG PO TABS
1000.0000 mg | ORAL_TABLET | Freq: Once | ORAL | Status: AC
  Administered 2017-05-31: 1000 mg via ORAL
  Filled 2017-05-31: qty 2

## 2017-05-31 MED ORDER — IOPAMIDOL (ISOVUE-300) INJECTION 61%
INTRAVENOUS | Status: AC
  Administered 2017-05-31: 100 mL
  Filled 2017-05-31: qty 100

## 2017-05-31 MED ORDER — ONDANSETRON HCL 4 MG/2ML IJ SOLN
4.0000 mg | Freq: Once | INTRAMUSCULAR | Status: AC
  Administered 2017-05-31: 4 mg via INTRAVENOUS
  Filled 2017-05-31: qty 2

## 2017-05-31 MED ORDER — CIPROFLOXACIN HCL 500 MG PO TABS
500.0000 mg | ORAL_TABLET | Freq: Once | ORAL | Status: AC
  Administered 2017-05-31: 500 mg via ORAL
  Filled 2017-05-31: qty 1

## 2017-05-31 MED ORDER — HYDROMORPHONE HCL 1 MG/ML IJ SOLN
0.5000 mg | Freq: Once | INTRAMUSCULAR | Status: AC
  Administered 2017-05-31: 0.5 mg via INTRAVENOUS
  Filled 2017-05-31: qty 1

## 2017-05-31 MED ORDER — ONDANSETRON 4 MG PO TBDP
ORAL_TABLET | ORAL | Status: AC
  Filled 2017-05-31: qty 1

## 2017-05-31 MED ORDER — ONDANSETRON 4 MG PO TBDP
4.0000 mg | ORAL_TABLET | Freq: Once | ORAL | Status: AC | PRN
  Administered 2017-05-31: 4 mg via ORAL

## 2017-05-31 NOTE — ED Notes (Signed)
Patient transported to CT 

## 2017-05-31 NOTE — ED Provider Notes (Signed)
[place] DEPT Provider Note   CSN: [id number] Arrival date & time: 05/31/17  1654     History   Chief Complaint Chief Complaint  Patient presents with  . Emesis  . Abdominal Pain  . Diarrhea    HPI Melissa May is a 36 y.o. female with PMHx  Asthma, de Quervain's tenosynovitis, depression, GERD, TMJ syndrome, and adenoma of the liver who presents today with chief complaint acute onset, progressively worsening constant lower abdominal pain which began yesterday morning. She states pain is constant, sharp, no aggravating or alleviating factors noted, localized to the right lower and left lower quadrants. Today she endorses development of fevers, chills, multiple episodes of watery nonbloody diarrhea, nausea, and multiple episodes of nonbloody nonbilious emesis.  Has not been able to keep any food down today, has had a few sips of Pedialyte which she has tolerated. Zofran in the ED slightly helpful. Denies urinary symptoms, vaginal symptoms, melena, hematochezia, hematuria.  She does endorse arthralgias of the bilateral knees, low back, as well as bitemporal throbbing headache.  Also endorses chest tightness But no chest pain. She has a history of asthma and states this feels usual for her typical asthma. She denies vision changes, numbness, tingling, or weakness. No recent trauma or falls. No known sick contacts, no recent treatment with antibiotics that she was treated for a yeast infection last month. Last oral intake before symptoms began was nachos from [employer]. She does not use birth control, is currently sexually active with her husband only.  Scheduled to follow up with a hematologist today due to elevated WBC, but was unable to follow up due to her symptoms today.   The history is provided by the patient.    Past Medical History:  Diagnosis Date  . Asthma    prn inhaler  . De Quervain's tenosynovitis, left 04/2015  . Depression   . 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 GERD  (gastroesophageal reflux disease)   . Obese   . TMJ syndrome     Patient Active Problem List   Diagnosis Date Noted  . Adenoma of liver 02/08/2016  . Rhinitis, allergic 10/06/2015  . Recurrent sinusitis 10/06/2015  . Extrinsic asthma 11/11/2014  . H/O: depression 04/19/2014  . Cesarean delivery delivered 04/16/2014  . Hypermenorrhea 12/21/2012    Past Surgical History:  Procedure Laterality Date  . CESAREAN SECTION N/A 04/16/2014   Procedure: CESAREAN SECTION;  Surgeon: Elveria Royals, MD;  Location: [facility];  Service: Obstetrics;  Laterality: N/A;  . DORSAL COMPARTMENT RELEASE Left 04/22/2015   Procedure: LEFT WRIST DEQUERVAIN'S RELEASE;  Surgeon: Milly Jakob, MD;  Location: [facility];  Service: Orthopedics;  Laterality: Left;  . STRABISMUS SURGERY     as an infant  . STRABISMUS SURGERY Right 06/22/2014   Procedure: REPAIR STRABISMUS RIGHT EYE;  Surgeon: Derry Skill, MD;  Location: [facility];  Service: Ophthalmology;  Laterality: Right;  . UPPER GASTROINTESTINAL ENDOSCOPY  2009  . WISDOM TOOTH EXTRACTION      OB History    Gravida Para Term Preterm AB Living   1 1 1     1    SAB TAB Ectopic Multiple Live Births           1       Home Medications    Prior to Admission medications   Medication Sig Start Date End Date Taking? Authorizing Provider  albuterol (PROAIR HFA) 108 (90 Base) MCG/ACT inhaler Inhale 2 puffs  into the lungs every 6 (six) hours as needed for wheezing or shortness of breath.   Yes [provider]  cetirizine (ZYRTEC) 10 MG tablet Take 10 mg by mouth at bedtime. Reported on 10/06/2015   Yes [provider]  FLUoxetine (PROZAC) 40 MG capsule Take 80 mg by mouth at bedtime.    Yes [provider]  ibuprofen (ADVIL,MOTRIN) 200 MG tablet Take 200 mg by mouth every 6 (six) hours as needed for headache or cramping (pain).   Yes [provider]  montelukast (SINGULAIR) 10 MG tablet Take 10  mg by mouth at bedtime.   Yes [provider]  Multiple Vitamin (MULTIVITAMIN WITH MINERALS) TABS tablet Take 1 tablet by mouth at bedtime.   Yes [provider]  omeprazole (PRILOSEC) 20 MG capsule Take 20 mg by mouth at bedtime. 05/10/17  Yes [provider]  ciprofloxacin (CIPRO) 500 MG tablet Take 1 tablet (500 mg total) by mouth every 12 (twelve) hours. 06/01/17 06/08/17  Rodell Perna A, PA-C  metroNIDAZOLE (FLAGYL) 500 MG tablet Take 1 tablet (500 mg total) by mouth 2 (two) times daily. 06/01/17 06/08/17  Rodell Perna A, PA-C  ondansetron (ZOFRAN ODT) 4 MG disintegrating tablet Take 1 tablet (4 mg total) by mouth every 8 (eight) hours as needed for nausea or vomiting. 06/01/17   Renita Papa, PA-C    Family History Family History  Problem Relation Age of Onset  . Cancer Father   . Kidney cancer Father   . Hypertension Father   . Heart disease Maternal Grandfather   . Rheum arthritis Mother   . Asthma Paternal Grandmother   . Diabetes Paternal Grandmother   . Hypertension Paternal Grandmother   . Allergies Brother   . Rheum arthritis Maternal Grandmother   . Stroke Maternal Grandmother     Social History Social History  Substance Use Topics  . Smoking status: Never Smoker  . Smokeless tobacco: Never Used  . Alcohol use 0.0 oz/week     Comment: rarely     Allergies   Lexapro [escitalopram oxalate]; Pristiq [desvenlafaxine succinate er]; Adhesive [tape]; and Morphine and related   Review of Systems Review of Systems  Constitutional: Negative for chills and fever.  Eyes: Negative for visual disturbance.  Respiratory: Positive for chest tightness. Negative for shortness of breath.   Gastrointestinal: Positive for abdominal pain, diarrhea, nausea and vomiting. Negative for blood in stool and constipation.  Genitourinary: Negative for dysuria, flank pain, frequency, hematuria, urgency, vaginal bleeding, vaginal discharge and vaginal pain.  Neurological:  Positive for headaches. Negative for weakness.  All other systems reviewed and are negative.    Physical Exam Updated Vital Signs BP (!) 143/69   Pulse (!) 104   Temp 99.9 F (37.7 C) (Oral)   Resp 18   LMP 05/17/2017   SpO2 95%   Physical Exam  Constitutional: She is oriented to person, place, and time. She appears well-developed and well-nourished. No distress.  HENT:  Head: Normocephalic and atraumatic.  Right Ear: External ear normal.  Left Ear: External ear normal.  Eyes: Pupils are equal, round, and reactive to light. Conjunctivae and EOM are normal. Right eye exhibits no discharge. Left eye exhibits no discharge.  Neck: Normal range of motion. Neck supple. No JVD present. No tracheal deviation present.  Cardiovascular: Normal rate, regular rhythm, normal heart sounds and intact distal pulses.   Pulmonary/Chest: Effort normal and breath sounds normal. No respiratory distress. She has no wheezes. She has no rales.  She exhibits no tenderness.  Abdominal: Soft. She exhibits no distension. There is tenderness. There is no guarding.  RLQ and LLQ TTP, Murphy sign absent, Rovsing sign absent, there is TTP at McBurney's point, no CVA tenderness  Genitourinary:  Genitourinary Comments: Examination performed in the presence of a chaperone. No lesions or masses to the external genitalia. Cervical os is closed, no bleeding. Moderate amount of discharge in the vaginal vault. No masses or lesions to the vaginal wall. No cervical motion tenderness, no adnexal tenderness  Musculoskeletal: Normal range of motion. She exhibits no edema or tenderness.  No midline spine TTP, no paraspinal muscle tenderness, no deformity, crepitus, or step-off noted  Neurological: She is alert and oriented to person, place, and time. No cranial nerve deficit or sensory deficit.  Skin: Skin is warm and dry. She is not diaphoretic. No erythema.  Psychiatric: She has a normal mood and affect. Her behavior is normal.    Nursing note and vitals reviewed.  ED Treatments / Results  Labs (all labs ordered are listed, but only abnormal results are displayed) Labs Reviewed  WET PREP, GENITAL - Abnormal; Notable for the following:       Result Value   WBC, Wet Prep HPF POC FEW (*)    All other components within normal limits  COMPREHENSIVE METABOLIC PANEL - Abnormal; Notable for the following:    Glucose, Bld 123 (*)    Calcium 8.7 (*)    All other components within normal limits  CBC - Abnormal; Notable for the following:    WBC 17.6 (*)    Platelets 418 (*)    All other components within normal limits  URINALYSIS, ROUTINE W REFLEX MICROSCOPIC - Abnormal; Notable for the following:    APPearance TURBID (*)    Specific Gravity, Urine >1.030 (*)    Bilirubin Urine SMALL (*)    All other components within normal limits  URINALYSIS, MICROSCOPIC (REFLEX) - Abnormal; Notable for the following:    Bacteria, UA RARE (*)    Squamous Epithelial / LPF 0-5 (*)    All other components within normal limits  LIPASE, BLOOD  PREGNANCY, URINE  I-STAT BETA HCG BLOOD, ED ([facility], [facility], [facility] ONLY)  GC/CHLAMYDIA PROBE AMP ([place]) NOT AT [facility]    EKG  EKG Interpretation None       Radiology Ct Abdomen Pelvis W Contrast  Result Date: 05/31/2017 CLINICAL DATA:  Abdominal pain EXAM: CT ABDOMEN AND PELVIS WITH CONTRAST TECHNIQUE: Multidetector CT imaging of the abdomen and pelvis was performed using the standard protocol following bolus administration of intravenous contrast. CONTRAST:  134mL ISOVUE-300 IOPAMIDOL (ISOVUE-300) INJECTION 61% COMPARISON:  MRI 08/15/2016, ultrasound 01/14/2016 FINDINGS: Lower chest: Lung bases demonstrate no acute consolidation or effusion. Borderline cardiomegaly. Hepatobiliary: Hepatic steatosis. Multiple hyperenhancing masses within the liver, the largest lesion is seen in the inferior right hepatic lobe and measures 2.6 cm and is unchanged. No calcified gallstones. No biliary dilatation  Pancreas: Unremarkable. No pancreatic ductal dilatation or surrounding inflammatory changes. Spleen: Borderline enlarged at 13 cm Adrenals/Urinary Tract: Adrenal glands are unremarkable. Kidneys are normal, without renal calculi, focal lesion, or hydronephrosis. Bladder is unremarkable. Stomach/Bowel: Stomach is within normal limits. Appendix appears normal. No evidence of bowel wall thickening, distention, or inflammatory changes. Sigmoid colon diverticular disease without acute inflammation. Vascular/Lymphatic: Nonaneurysmal aorta. Scattered mesenteric and right lower quadrant lymph nodes. Reproductive: Uterus and bilateral adnexa are unremarkable. Other: Negative for free air or free fluid. Small fat in the umbilicus. Musculoskeletal: No acute  or significant osseous findings. IMPRESSION: 1. No CT evidence for acute intra-abdominal or pelvic abnormality. 2. Stable enhancing liver masses ; prior MRI characterized the masses as consistent with hepatic adenoma Electronically Signed   By: Donavan Foil M.D.   On: 05/31/2017 22:24    Procedures Procedures (including critical care time)  Medications Ordered in ED Medications  ondansetron (ZOFRAN-ODT) 4 MG disintegrating tablet (not administered)  ondansetron (ZOFRAN-ODT) disintegrating tablet 4 mg (4 mg Oral Given 05/31/17 1743)  sodium chloride 0.9 % bolus 1,000 mL (0 mLs Intravenous Stopped 05/31/17 2352)  ondansetron (ZOFRAN) injection 4 mg (4 mg Intravenous Given 05/31/17 2242)  HYDROmorphone (DILAUDID) injection 0.5 mg (0.5 mg Intravenous Given 05/31/17 2242)  iopamidol (ISOVUE-300) 61 % injection (100 mLs  Contrast Given 05/31/17 2130)  acetaminophen (TYLENOL) tablet 1,000 mg (1,000 mg Oral Given 05/31/17 2242)  ciprofloxacin (CIPRO) tablet 500 mg (500 mg Oral Given 05/31/17 2351)  metroNIDAZOLE (FLAGYL) tablet 500 mg (500 mg Oral Given 05/31/17 2351)     Initial Impression / Assessment and Plan / ED Course  I have reviewed the triage vital signs and  the nursing notes.  Pertinent labs & imaging results that were available during my care of the patient were reviewed by me and considered in my medical decision making (see chart for details).     Patient with lower abdominal pain, nausea, vomiting, and diarrhea since yesterday. Initially afebrile, however became febrile in the ED with improvement after administration of Tylenol. Initially tachycardic with improvement after administration of fluids. Vital signs are otherwise stable. Primarily painful in the left lower quadrant with mild right lower quadrant TTP. She does have a leukocytosis of 17, however she is being evaluated for a chronic leukocytosis by hematology. UA is not concerning for UTI or nephrolithiasis. Remainder of lab work is reassuring. CT shows no acute abdominal pelvic abnormalities, and stable hepatic adenoma. Pelvic examination is not suggestive of PID, and workup and history make TOA, ectopic pregnancy, and ovarian torsion very unlikely. She does have evidence of diverticulosis but no CT scan evidence of diverticulitis. No evidence of obstruction, perforation, appendicitis or other acute surgical abdominal pathology. Possible diverticulitis without CT scan evidence versus infectious diarrhea. Will treat with Cipro and Flagyl. On reevaluation, patient is resting comfortably, states her pain feels much better, and she is tolerating PO. Repeat abdominal examination unremarkable. She will follow-up with her primary care physician in the next 2-3 days. Discussed indications for return to the ED. Patient and patient's mother verbalized understanding of and agreement with plan and patient is stable for discharge home at this time.   Final Clinical Impressions(s) / ED Diagnoses   Final diagnoses:  Nausea vomiting and diarrhea  Lower abdominal pain    New Prescriptions New Prescriptions   CIPROFLOXACIN (CIPRO) 500 MG TABLET    Take 1 tablet (500 mg total) by mouth every 12 (twelve)  hours.   METRONIDAZOLE (FLAGYL) 500 MG TABLET    Take 1 tablet (500 mg total) by mouth 2 (two) times daily.   ONDANSETRON (ZOFRAN ODT) 4 MG DISINTEGRATING TABLET    Take 1 tablet (4 mg total) by mouth every 8 (eight) hours as needed for nausea or vomiting.     Renita Papa, PA-C 06/01/17 [place], [place], DO 06/03/17 1507

## 2017-05-31 NOTE — ED Triage Notes (Signed)
Pt to ER with complaint of lower abdominal pain onset this morning with nausea vomiting and diarrhea. Pt reports generalized body aches.

## 2017-06-01 ENCOUNTER — Encounter: Payer: PRIVATE HEALTH INSURANCE | Admitting: Oncology

## 2017-06-01 LAB — GC/CHLAMYDIA PROBE AMP (~~LOC~~) NOT AT ARMC
Chlamydia: NEGATIVE
Neisseria Gonorrhea: NEGATIVE

## 2017-06-01 LAB — I-STAT BETA HCG BLOOD, ED (MC, WL, AP ONLY): I-stat hCG, quantitative: <5 m[IU]/mL (ref <5)

## 2017-06-01 MED ORDER — CIPROFLOXACIN HCL 500 MG PO TABS: 500.0000 mg | ORAL_TABLET | Freq: Two times a day (BID) | ORAL | 0 refills | Status: AC

## 2017-06-01 MED ORDER — METRONIDAZOLE 500 MG PO TABS: 500.0000 mg | ORAL_TABLET | Freq: Two times a day (BID) | ORAL | 0 refills | Status: AC

## 2017-06-01 MED ORDER — ONDANSETRON 4 MG PO TBDP: 4.0000 mg | ORAL_TABLET | Freq: Three times a day (TID) | ORAL | 0 refills | Status: DC | PRN

## 2017-06-01 NOTE — Discharge Instructions (Signed)
Please take all of your antibiotics until finished!   You may develop abdominal discomfort or diarrhea from the antibiotic.  You may help offset this with probiotics which you can buy or get in yogurt. Do not eat  or take the probiotics until 2 hours after your antibiotic.  You may take Tylenol or ibuprofen for your pain and fever. You may also apply a heating pad to the lower abdomen for comfort. Use Zofran as needed for nausea, and advance her diet slowly, sticking with mostly clear fluids for the next day or 2, then moving on to bland foods. Wash your hands frequently to avoid spread of germs. Follow-up with primary care physician for reevaluation of your symptoms in the next 3-4 days. Return to the ED immediately if any concerning signs or symptoms develop such as fever greater than 102F uncontrolled by ibuprofen or Tylenol, blood in your urine or stool, worsening pain, or if you are unable to keep any food or drink down.

## 2017-06-24 ENCOUNTER — Telehealth: Payer: Self-pay | Admitting: Obstetrics & Gynecology

## 2017-06-24 NOTE — Telephone Encounter (Signed)
Spoke with patient. Patient requesting RX for "yeast infection"?  States treated 2weeks ago for intestinal infection with cipro and flagyl. Reports vaginal itching, discharge with odor, "no color". Denies any urinary complaints or pelvic pain.   Advised OV needed for further evaluation, offered appointment for 10/26 at 10:45am with Dr. Sabra Heck, patient declined. Patient request 4pm appointment if available with any provider. Patient scheduled for 10/26 at 4pm with Melvia Heaps, CNM.   Advised patient will review with Melvia Heaps, CNM and return call with any additional recommendations, patient is agreeable.   Routing to provider for final review. Patient is agreeable to disposition. Will close encounter.  Cc: Dr. Sabra Heck

## 2017-06-24 NOTE — Telephone Encounter (Signed)
Patient is having yeast symptoms and would like to speak with nurse.

## 2017-06-25 ENCOUNTER — Encounter: Payer: Self-pay | Admitting: Certified Nurse Midwife

## 2017-06-25 ENCOUNTER — Ambulatory Visit (INDEPENDENT_AMBULATORY_CARE_PROVIDER_SITE_OTHER): Payer: PRIVATE HEALTH INSURANCE | Admitting: Certified Nurse Midwife

## 2017-06-25 VITALS — BP 116/76 | HR 64 | Resp 16 | Ht 63.0 in | Wt 246.0 lb

## 2017-06-25 DIAGNOSIS — B373 Candidiasis of vulva and vagina: Secondary | ICD-10-CM | POA: Diagnosis not present

## 2017-06-25 DIAGNOSIS — B3731 Acute candidiasis of vulva and vagina: Secondary | ICD-10-CM

## 2017-06-25 MED ORDER — FLUCONAZOLE 150 MG PO TABS: ORAL_TABLET | ORAL | 0 refills | Status: DC

## 2017-06-25 MED ORDER — NYSTATIN 100000 UNIT/GM EX CREA: TOPICAL_CREAM | CUTANEOUS | 0 refills | Status: DC

## 2017-06-25 NOTE — Progress Notes (Signed)
36 y.o. Married Caucasian female G1P1001 here with complaint of vaginal symptoms of itching, burning, and increase discharge. Describes discharge as clear.Onset of symptoms14 days ago. Denies new personal products or vaginal dryness. Patient was treated with Cipro and Flagyl for intestinal issues and feels this is the problem.  Urinary symptoms none . Contraception is none. Not sexually active during ovulation this time due to this problem.  ROS Pertinent to HPI  O:Healthy female WDWN Affect: normal, orientation x 3  Exam: Skin: warm and dry Abdomen: soft, non tender Lymph node: no enlargement or tenderness Pelvic exam: External genital: normal female with increase pink and slight cracking of skin , slight exudate, wet prep taken BUS: negative Vagina: white slightly thick discharge noted. Ph:5.0   ,Wet prep taken Cervix: normal, non tender, no CMT Uterus: normal, non tender Adnexa:normal, non tender, no masses or fullness noted   Wet Prep results: KOH,Saline + for yeast only, no clue cells noted   A:Normal pelvic exam Yeast vaginitis/vulvitis probably due to antibiotic use   P:Discussed findings of yeast/vulvitis and etiology. Discussed Aveeno sitz bath  for comfort. Avoid moist clothes or pads for exor baking soda sitz bathtended period of time. Rx: Diflucan see order with instructions Rx Nystatin cream see order with instructions, also obtain 1 % hydrocortisone cream and mix with Nystatin cream and apply thinly to external vulva area and perineal area of cracking. Voiced understanding.  Rv prn

## 2018-02-18 DIAGNOSIS — K219 Gastro-esophageal reflux disease without esophagitis: Secondary | ICD-10-CM | POA: Insufficient documentation

## 2018-08-15 ENCOUNTER — Encounter: Payer: Self-pay | Admitting: Obstetrics and Gynecology

## 2018-08-15 ENCOUNTER — Telehealth: Payer: Self-pay | Admitting: *Deleted

## 2018-08-15 ENCOUNTER — Telehealth: Payer: Self-pay | Admitting: Obstetrics & Gynecology

## 2018-08-15 ENCOUNTER — Ambulatory Visit (INDEPENDENT_AMBULATORY_CARE_PROVIDER_SITE_OTHER): Payer: PRIVATE HEALTH INSURANCE | Admitting: Obstetrics and Gynecology

## 2018-08-15 ENCOUNTER — Other Ambulatory Visit: Payer: Self-pay

## 2018-08-15 VITALS — BP 126/78 | HR 88 | Ht 63.0 in | Wt 251.6 lb

## 2018-08-15 DIAGNOSIS — N926 Irregular menstruation, unspecified: Secondary | ICD-10-CM | POA: Diagnosis not present

## 2018-08-15 DIAGNOSIS — Z3201 Encounter for pregnancy test, result positive: Secondary | ICD-10-CM | POA: Diagnosis not present

## 2018-08-15 LAB — POCT URINE PREGNANCY: Preg Test, Ur: NEGATIVE

## 2018-08-15 LAB — BETA HCG QUANT (REF LAB): HCG QUANT: 6 m[IU]/mL

## 2018-08-15 NOTE — Telephone Encounter (Signed)
Patient called stating "I may have had a miscarriage" and would like to see Dr.Miller.

## 2018-08-15 NOTE — Telephone Encounter (Signed)
-----   Message from Nunzio Cobbs, MD sent at 08/15/2018  4:57 PM EST ----- Please inform patient of her hCG which is positive at a level of 6.  A level of 5 or less is considered negative.  I suspect the clinical picture represents a miscarriage, but we will need to repeat the level in 48 hours to understand if it is going up, down, or staying the same. I would expect the number to be negative in 48 hours if this is a miscarriage. I recommend she have her next hCG blood draw at [facility] on 08/17/18 afternoon in MAU.  She can also have a Rhogam injection at that time as her documented blood type is O negative. I recommend pelvic rest and ectopic precautions.   Cc- Dr. Sabra Heck

## 2018-08-15 NOTE — Telephone Encounter (Signed)
Spoke with patient. Patient took UPT x3 on 3 separate days last week, all were positive. Patient reports spotting on Saturday and heavy bleeding with small "pea" size clots and mild cramping on Sunday. Repeated UPT on Saturday night, negative. Report bleeding today is normal flow, some mild cramping. Nausea, no vomiting. Denies fever/chills.  LMP 07/12/18. No contraceptives.  Blood type is O neg (type & screen in Epic, 04/15/14) Patient request OV with Dr. Sabra Heck.  Advised Dr. Sabra Heck is in [place], I will review with covering provider and return call with recommendations. Patient is agreeable.   Dr. Quincy Simmonds -pleas advise.   Cc: Dr. Sabra Heck

## 2018-08-15 NOTE — Progress Notes (Signed)
hcgGYNECOLOGY  VISIT   HPI: 37 y.o.   Married  Caucasian  female   G1P1001 with Patient's last menstrual period was 07/12/2018 (exact date).   here for vaginal bleeding with positive home pregnancy test.  No birth control for 2.5 years and would welcome pregnancy.  Was late for menses last week, 4 days ago.  She did 2 home UPTs which were positive.  Then started spotting and bleeding and had a negative UPT.   Some nausea.  No breast tenderness.  Did have some bloating.  Some fatigue but has a 33 yo son.   Light cramping but nothing is long term or needs pain medication per patient.   Hx infertility.  Had a spontaneous conception with her child following a HSG.   States she was told her uterine lining is thin by [place] in past.    Recent menses: 10/17 - 10/23.  11/12 - 11/17.   UPT: Neg  PCP - Levin Bacon, NP   GYNECOLOGIC HISTORY: Patient's last menstrual period was 07/12/2018 (exact date). Contraception:  None Menopausal hormone therapy:  n/a Last mammogram:  n/a Last pap smear:  04-15-17 Neg:Neg HR HPV                               04-14-13 Neg:Neg HR HPV        OB History    Gravida  1   Para  1   Term  1   Preterm      AB      Living  1     SAB      TAB      Ectopic      Multiple      Live Births  1              Patient Active Problem List   Diagnosis Date Noted  . Adenoma of liver 02/08/2016  . Rhinitis, allergic 10/06/2015  . Recurrent sinusitis 10/06/2015  . Extrinsic asthma 11/11/2014  . H/O: depression 04/19/2014  . Cesarean delivery delivered 04/16/2014  . Hypermenorrhea 12/21/2012    Past Medical History:  Diagnosis Date  . Anxiety   . Asthma    prn inhaler  . De Quervain's tenosynovitis, left 04/2015  . Depression   . 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 GERD (gastroesophageal reflux disease)   . Obese   . TMJ syndrome     Past Surgical History:  Procedure Laterality Date  . CESAREAN SECTION N/A 04/16/2014   Procedure: CESAREAN SECTION;  Surgeon: Elveria Royals, MD;  Location: [facility];  Service: Obstetrics;  Laterality: N/A;  . DORSAL COMPARTMENT RELEASE Left 04/22/2015   Procedure: LEFT WRIST DEQUERVAIN'S RELEASE;  Surgeon: Milly Jakob, MD;  Location: [facility];  Service: Orthopedics;  Laterality: Left;  . STRABISMUS SURGERY     as an infant  . STRABISMUS SURGERY Right 06/22/2014   Procedure: REPAIR STRABISMUS RIGHT EYE;  Surgeon: Derry Skill, MD;  Location: [facility];  Service: Ophthalmology;  Laterality: Right;  . UPPER GASTROINTESTINAL ENDOSCOPY  2009  . WISDOM TOOTH EXTRACTION      Current Outpatient Medications  Medication Sig Dispense Refill  . albuterol (PROAIR HFA) 108 (90 Base) MCG/ACT inhaler Inhale 2 puffs into the lungs every 6 (six) hours as needed for wheezing or shortness of breath.    . ALPRAZolam (XANAX) 0.5 MG tablet Take 0.5 mg by mouth at  bedtime as needed for anxiety.    . cetirizine (ZYRTEC) 10 MG tablet Take 10 mg by mouth at bedtime. Reported on 10/06/2015    . FLUoxetine (PROZAC) 40 MG capsule Take 80 mg by mouth at bedtime.     . montelukast (SINGULAIR) 10 MG tablet Take 10 mg by mouth at bedtime.    . Multiple Vitamin (MULTIVITAMIN WITH MINERALS) TABS tablet Take 1 tablet by mouth at bedtime.    Marland Kitchen omeprazole (PRILOSEC) 20 MG capsule Take 20 mg by mouth at bedtime.  0   No current facility-administered medications for this visit.      ALLERGIES: Lexapro [escitalopram oxalate]; Pristiq [desvenlafaxine succinate er]; Adhesive [tape]; and Morphine and related  Family History  Problem Relation Age of Onset  . Cancer Father   . Kidney cancer Father   . Hypertension Father   . Heart disease Maternal Grandfather   . Rheum arthritis Mother   . Asthma Paternal Grandmother   . Diabetes Paternal Grandmother   . Hypertension Paternal Grandmother   . Allergies Brother   . Rheum arthritis Maternal Grandmother   . Stroke Maternal  Grandmother     Social History   Socioeconomic History  . Marital status: Married    Spouse name: Not on file  . Number of children: 0  . Years of education: Not on file  . Highest education level: Not on file  Occupational History  . Occupation: Transport planner  Social Needs  . Financial resource strain: Not on file  . Food insecurity:    Worry: Not on file    Inability: Not on file  . Transportation needs:    Medical: Not on file    Non-medical: Not on file  Tobacco Use  . Smoking status: Never Smoker  . Smokeless tobacco: Never Used  Substance and Sexual Activity  . Alcohol use: No    Alcohol/week: 0.0 standard drinks  . Drug use: No  . Sexual activity: Yes    Partners: Male    Birth control/protection: None  Lifestyle  . Physical activity:    Days per week: Not on file    Minutes per session: Not on file  . Stress: Not on file  Relationships  . Social connections:    Talks on phone: Not on file    Gets together: Not on file    Attends religious service: Not on file    Active member of club or organization: Not on file    Attends meetings of clubs or organizations: Not on file    Relationship status: Not on file  . Intimate partner violence:    Fear of current or ex partner: Not on file    Emotionally abused: Not on file    Physically abused: Not on file    Forced sexual activity: Not on file  Other Topics Concern  . Not on file  Social History Narrative   ** Merged History Encounter **        Review of Systems  All other systems reviewed and are negative.   PHYSICAL EXAMINATION:    BP 126/78 (BP Location: Right Arm, Patient Position: Sitting, Cuff Size: Large)   Pulse 88   Ht 5\' 3"  (1.6 m)   Wt 251 lb 9.6 oz (114.1 kg)   LMP 07/12/2018 (Exact Date)   BMI 44.57 kg/m     General appearance: alert, cooperative and appears stated age   Pelvic: External genitalia:  no lesions  Urethra:  normal appearing urethra with no masses, tenderness or  lesions              Bartholins and Skenes: normal                 Vagina: normal appearing vagina with normal color and discharge, no lesions              Cervix: no lesions. Blood in the vagina.                 Bimanual Exam:  Uterus:  normal size, contour, position, consistency, mobility, minimally tender.              Adnexa: no mass, fullness, tenderness               Chaperone was present for exam.  ASSESSMENT  Early pregnancy loss.  Hx hepatic adenoma.   PLAN  CBC and STAT quant hCG. Discussed early pregnancy loss.  Pelvic US and repeat hGC if serum test is positive.  PNV.  Stop Xanax.  Ectopic precautions.  FU with Dr. Sabra Heck.   An After Visit Summary was printed and given to the patient.  _25____ minutes face to face time of which over 50% was spent in counseling.

## 2018-08-15 NOTE — Telephone Encounter (Signed)
Unable to reach. Left message, advised office phones go off at 4:30pm, will return call in 10 min.    Results reviewed with Dr. Quincy Simmonds.  Hcg 6.  Needs to go to [facility]U Wednesday afternoon, 08/17/18, for repeat Hcg quant and rhogam. O neg.  Review ER precaution for severe pain and heavy bleeding.  No sexual activity

## 2018-08-15 NOTE — Telephone Encounter (Addendum)
Attempted work number, no answer.   Left detailed message on mobile number, ok per dpr. Advised as seen below per Dr. Quincy Simmonds. Advised office phones are off, please return call to office at [phone] to confirm message received. ER precautions provided.

## 2018-08-15 NOTE — Telephone Encounter (Signed)
Call reviewed with Dr. Quincy Simmonds. Recommended OV with Dr. Sabra Heck.   Schedule reviewed with nursing supervisor. Call returned to patient. Patient will come into office today at 1:30pm for labs, OV scheduled with Dr. Sabra Heck at 1:45pm. Patient aware Dr. Sabra Heck will be coming from [place], our office will return call if any changes in schedule. Patient verbalizes understanding and is agreeable.   Routing to Dr. Sabra Heck. Encounter closed.

## 2018-08-16 ENCOUNTER — Encounter: Payer: Self-pay | Admitting: Obstetrics and Gynecology

## 2018-08-16 ENCOUNTER — Telehealth: Payer: Self-pay | Admitting: Gynecology

## 2018-08-16 LAB — CBC WITH DIFFERENTIAL/PLATELET
BASOS: 1 %
Basophils Absolute: 0.1 10*3/uL (ref 0.0–0.2)
EOS (ABSOLUTE): 0.3 10*3/uL (ref 0.0–0.4)
EOS: 3 %
HEMATOCRIT: 36.2 % (ref 34.0–46.6)
Hemoglobin: 11.7 g/dL (ref 11.1–15.9)
Immature Grans (Abs): 0 10*3/uL (ref 0.0–0.1)
Immature Granulocytes: 0 %
LYMPHS ABS: 3.2 10*3/uL — AB (ref 0.7–3.1)
Lymphs: 26 %
MCH: 26.4 pg — AB (ref 26.6–33.0)
MCHC: 32.3 g/dL (ref 31.5–35.7)
MCV: 82 fL (ref 79–97)
Monocytes Absolute: 0.6 10*3/uL (ref 0.1–0.9)
Monocytes: 5 %
NEUTROS ABS: 8 10*3/uL — AB (ref 1.4–7.0)
NEUTROS PCT: 65 %
PLATELETS: 496 10*3/uL — AB (ref 150–450)
RBC: 4.44 x10E6/uL (ref 3.77–5.28)
RDW: 14 % (ref 12.3–15.4)
WBC: 12.3 10*3/uL — ABNORMAL HIGH (ref 3.4–10.8)

## 2018-08-16 NOTE — Telephone Encounter (Signed)
Spoke with patient. Patient received voicemail, verbalizes understanding. Patient will go to MAU for repeat Hcg and Rhogam on Wednesday afternoon,12/18, as directed. Patient denies pain, bleeding has decreased. Patient aware to call with any concerns.   Routing to provider for final review. Patient is agreeable to disposition. Will close encounter.  Cc: Dr. Sabra Heck

## 2018-08-16 NOTE — Telephone Encounter (Signed)
On-call note: The patient was evaluated in the office yesterday with LMP 07/12/2018 and positive home pregnancy test.  Began bleeding several days before.  Exam showed uterus normal size with no acute findings.  Quantitative hCG was 6.  The patient's plan was to return to maternity admissions tomorrow for follow-up hCG and RhoGam if negative.  Ultrasound if hCG is rising.  Patient reports today bleeding has picked up like a menses.  Also with some cramping.  We discussed that this appears to be an expected course for SAB.  We discussed options to include observation versus maternity admission evaluation this evening.  The patient is comfortable with observation this evening and will follow-up for planned blood work tomorrow as already arranged at maternity admissions.  She will call this evening if bleeding becomes concerning and she feels evaluation sooner is needed.

## 2018-08-17 ENCOUNTER — Other Ambulatory Visit: Payer: Self-pay

## 2018-08-17 ENCOUNTER — Encounter (HOSPITAL_COMMUNITY): Payer: Self-pay

## 2018-08-17 ENCOUNTER — Inpatient Hospital Stay (HOSPITAL_COMMUNITY)
Admission: AD | Admit: 2018-08-17 | Discharge: 2018-08-17 | Disposition: A | Discharge status: Home / Self Care | Payer: PRIVATE HEALTH INSURANCE | Source: Ambulatory Visit | Attending: Obstetrics and Gynecology | Admitting: Obstetrics and Gynecology

## 2018-08-17 DIAGNOSIS — O039 Complete or unspecified spontaneous abortion without complication: Secondary | ICD-10-CM

## 2018-08-17 DIAGNOSIS — Z6791 Unspecified blood type, Rh negative: Secondary | ICD-10-CM | POA: Diagnosis not present

## 2018-08-17 DIAGNOSIS — R109 Unspecified abdominal pain: Secondary | ICD-10-CM | POA: Diagnosis present

## 2018-08-17 DIAGNOSIS — O26891 Other specified pregnancy related conditions, first trimester: Secondary | ICD-10-CM

## 2018-08-17 LAB — HCG, QUANTITATIVE, PREGNANCY: HCG, BETA CHAIN, QUANT, S: 1 m[IU]/mL (ref <5)

## 2018-08-17 MED ORDER — RHO D IMMUNE GLOBULIN 1500 UNIT/2ML IJ SOSY
300.0000 ug | PREFILLED_SYRINGE | Freq: Once | INTRAMUSCULAR | Status: AC
  Administered 2018-08-17: 300 ug via INTRAMUSCULAR
  Filled 2018-08-17: qty 2

## 2018-08-17 NOTE — MAU Note (Signed)
Pt here for follow up lab work.. Pt complains of new onset back pain that she rates 3/10 as well as some cramping that she rates 2/10.  Pt has not taken any medication for pain today. Pt also reports some nickel-quarter sized blood clots.

## 2018-08-17 NOTE — MAU Provider Note (Signed)
History     CSN: [id number]  Arrival date and time: 08/17/18 1459   None     Chief Complaint  Patient presents with  . Abdominal Pain  . Follow-up  . Back Pain   HPI   [name] is a 37 y.o. female G62P1001 @ Unknown here for f/u. She was seen by Dr. Sanjuan May office on Monday for possible miscarriage. She was sent here for blood work today. She reports that on Thursday and Friday of last week she had a positive pregnancy test. Saturday she started bleeding and had a negative pregnancy test. Here today for f/u including stat Quant. In the office on Monday she had a Quant of 6. She is scheduled for a f/u on Friday in the office. She denies vaginal bleeding. She has occasional lower abdominal cramping, none now.   OB History    Gravida  2   Para  1   Term  1   Preterm      AB      Living  1     SAB      TAB      Ectopic      Multiple      Live Births  1           Past Medical History:  Diagnosis Date  . Anxiety   . Asthma    prn inhaler  . De Quervain's tenosynovitis, left 04/2015  . Depression   . 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 Elevated WBC count   . GERD (gastroesophageal reflux disease)   . Obese   . TMJ syndrome     Past Surgical History:  Procedure Laterality Date  . CESAREAN SECTION N/A 04/16/2014   Procedure: CESAREAN SECTION;  Surgeon: Melissa Royals, MD;  Location: [facility];  Service: Obstetrics;  Laterality: N/A;  . DORSAL COMPARTMENT RELEASE Left 04/22/2015   Procedure: LEFT WRIST DEQUERVAIN'S RELEASE;  Surgeon: Melissa Jakob, MD;  Location: [facility];  Service: Orthopedics;  Laterality: Left;  . STRABISMUS SURGERY     as an infant  . STRABISMUS SURGERY Right 06/22/2014   Procedure: REPAIR STRABISMUS RIGHT EYE;  Surgeon: Melissa Skill, MD;  Location: [facility];  Service: Ophthalmology;  Laterality: Right;  . UPPER GASTROINTESTINAL ENDOSCOPY  2009  . WISDOM TOOTH EXTRACTION      Family  History  Problem Relation Age of Onset  . Cancer Father   . Kidney cancer Father   . Hypertension Father   . Heart disease Maternal Grandfather   . Rheum arthritis Mother   . Asthma Paternal Grandmother   . Diabetes Paternal Grandmother   . Hypertension Paternal Grandmother   . Allergies Brother   . Rheum arthritis Maternal Grandmother   . Stroke Maternal Grandmother     Social History   Tobacco Use  . Smoking status: Never Smoker  . Smokeless tobacco: Never Used  Substance Use Topics  . Alcohol use: No    Alcohol/week: 0.0 standard drinks  . Drug use: No    Allergies:  Allergies  Allergen Reactions  . Lexapro [Escitalopram Oxalate] Nausea And Vomiting  . Pristiq [Desvenlafaxine Succinate Er] Nausea Only  . Adhesive [Tape] Rash    Please use paper tape  . Morphine And Related Itching    Medications Prior to Admission  Medication Sig Dispense Refill Last Dose  . albuterol (PROAIR HFA) 108 (90 Base) MCG/ACT inhaler Inhale 2 puffs into the lungs every 6 (  six) hours as needed for wheezing or shortness of breath.   Taking  . ALPRAZolam (XANAX) 0.5 MG tablet Take 0.5 mg by mouth at bedtime as needed for anxiety.   Taking  . cetirizine (ZYRTEC) 10 MG tablet Take 10 mg by mouth at bedtime. Reported on 10/06/2015   Taking  . FLUoxetine (PROZAC) 40 MG capsule Take 80 mg by mouth at bedtime.    Taking  . montelukast (SINGULAIR) 10 MG tablet Take 10 mg by mouth at bedtime.   Taking  . Multiple Vitamin (MULTIVITAMIN WITH MINERALS) TABS tablet Take 1 tablet by mouth at bedtime.   Taking  . omeprazole (PRILOSEC) 20 MG capsule Take 20 mg by mouth at bedtime.  0 Taking   Results for orders placed or performed during the hospital encounter of 08/17/18 (from the past 48 hour(s))  Rh IG workup (includes ABO/Rh)     Status: None (Preliminary result)   Collection Time: 08/17/18  3:48 PM  Result Value Ref Range   Gestational Age(Wks) 1    ABO/RH(D) O NEG    Antibody Screen NEG    Unit  Number [id number]    Blood Component Type RHIG    Unit division 00    Status of Unit ISSUED    Transfusion Status      OK TO TRANSFUSE Performed at [facility], [address], [place], [place] [address]   hCG, quantitative, pregnancy     Status: None   Collection Time: 08/17/18  3:48 PM  Result Value Ref Range   hCG, Beta Chain, Quant, S 1 <5 mIU/mL    Comment:          GEST. AGE      CONC.  (mIU/mL)   <=1 WEEK        5 - 50     2 WEEKS       50 - 500     3 WEEKS       100 - 10,000     4 WEEKS     1,000 - 30,000     5 WEEKS     3,500 - 115,000   6-8 WEEKS     12,000 - 270,000    12 WEEKS     15,000 - 220,000        FEMALE AND NON-PREGNANT FEMALE:     LESS THAN 5 mIU/mL Performed at [facility], [address], [place], [place] [address]    Review of Systems  Gastrointestinal: Negative for abdominal pain.  Genitourinary: Negative for vaginal bleeding and vaginal discharge.   Physical Exam   Blood pressure (!) 111/57, pulse 99, temperature 98.4 F (36.9 C), temperature source Oral, resp. rate 20, height 5\' 3"  (1.6 m), weight 114.4 kg, last menstrual period 07/12/2018.  Physical Exam  Constitutional: She is oriented to person, place, and time. She appears well-developed and well-nourished. No distress.  HENT:  Head: Normocephalic.  Musculoskeletal: Normal range of motion.  Neurological: She is alert and oriented to person, place, and time.  Skin: Skin is warm. She is not diaphoretic.  Psychiatric: Her behavior is normal.   MAU Course  Procedures  None  MDM  Quant today 1 O negative blood type  Rhogam given today   Assessment and Plan   A:  1. SAB (spontaneous abortion)   2. Rh negative, antepartum, first trimester     P:  Discharge home in stable condition Comfort pack given Keep f/u on Friday Return to MAU if symptoms worsen  Lezlie Lye, NP 08/17/2018 5:37 PM

## 2018-08-17 NOTE — Discharge Instructions (Signed)
Miscarriage  A miscarriage is the loss of an unborn baby (fetus) before the 20th week of pregnancy.  Follow these instructions at home:  Medicines    · Take over-the-counter and prescription medicines only as told by your doctor.  · If you were prescribed antibiotic medicine, take it as told by your doctor. Do not stop taking the antibiotic even if you start to feel better.  · Do not take NSAIDs unless your doctor says that this is safe for you. NSAIDs include aspirin and ibuprofen. These medicines can cause bleeding.  Activity  · Rest as directed. Ask your doctor what activities are safe for you.  · Have someone help you at home during this time.  General instructions  · Write down how many pads you use each day and how soaked they are.  · Watch the amount of tissue or clumps of blood (blood clots) that you pass from your vagina. Save any large amounts of tissue for your doctor.  · Do not use tampons, douche, or have sex until your doctor approves.  · To help you and your partner with the process of grieving, talk with your doctor or seek counseling.  · When you are ready, meet with your doctor to talk about steps you should take for your health. Also, talk with your doctor about steps to take to have a healthy pregnancy in the future.  · Keep all follow-up visits as told by your doctor. This is important.  Contact a doctor if:  · You have a fever or chills.  · You have vaginal discharge that smells bad.  · You have more bleeding.  Get help right away if:  · You have very bad cramps or pain in your back or belly.  · You pass clumps of blood that are walnut-sized or larger from your vagina.  · You pass tissue that is walnut-sized or larger from your vagina.  · You soak more than 1 regular pad in an hour.  · You get light-headed or weak.  · You faint (pass out).  · You have feelings of sadness that do not go away, or you have thoughts of hurting yourself.  Summary  · A miscarriage is the loss of an unborn baby before  the 20th week of pregnancy.  · Follow your doctor's instructions for home care. Keep all follow-up appointments.  · To help you and your partner with the process of grieving, talk with your doctor or seek counseling.  This information is not intended to replace advice given to you by your health care provider. Make sure you discuss any questions you have with your health care provider.  Document Released: 11/09/2011 Document Revised: 09/22/2016 Document Reviewed: 09/22/2016  Elsevier Interactive Patient Education © 2019 [employer]

## 2018-08-18 LAB — RH IG WORKUP (INCLUDES ABO/RH)
ABO/RH(D): O NEG
Antibody Screen: NEGATIVE
GESTATIONAL AGE(WKS): 1
Unit division: 0

## 2018-08-19 ENCOUNTER — Ambulatory Visit (INDEPENDENT_AMBULATORY_CARE_PROVIDER_SITE_OTHER): Payer: PRIVATE HEALTH INSURANCE | Admitting: Obstetrics & Gynecology

## 2018-08-19 ENCOUNTER — Encounter: Payer: Self-pay | Admitting: Obstetrics & Gynecology

## 2018-08-19 ENCOUNTER — Other Ambulatory Visit: Payer: Self-pay

## 2018-08-19 VITALS — BP 120/60 | HR 84 | Resp 16 | Ht 63.0 in | Wt 251.2 lb

## 2018-08-19 DIAGNOSIS — O039 Complete or unspecified spontaneous abortion without complication: Secondary | ICD-10-CM | POA: Diagnosis not present

## 2018-08-19 NOTE — Progress Notes (Signed)
GYNECOLOGY  VISIT  CC:   Follow up miscarriage   HPI: 37 y.o. G13P1001 Married White or Caucasian female here for follow up miscarriage.  HCG was 1 when evaluated in the MAU yesterday.  She did get rhogam while in the MAU.  Emotionally, she is sad.  Realized this was a very wanted pregnancy.  She has used Clomid in the past.  Was not on this when conceived her son.  Is not on birth control.  She and husband are ok with pregnancy.  Until this recent chemical pregnancy, she felt like they were ok with not having another child.  She is rethinking this.  Aware ovulation agents would be appropriate given lack of regular cycles.  She is going to consider this and discuss with spouse.  GYNECOLOGIC HISTORY: Patient's last menstrual period was 08/13/2018 (exact date). Contraception: none Menopausal hormone therapy: none  Patient Active Problem List   Diagnosis Date Noted  . Adenoma of liver 02/08/2016  . Rhinitis, allergic 10/06/2015  . Recurrent sinusitis 10/06/2015  . Extrinsic asthma 11/11/2014  . H/O: depression 04/19/2014  . Cesarean delivery delivered 04/16/2014  . Hypermenorrhea 12/21/2012    Past Medical History:  Diagnosis Date  . Anxiety   . Asthma    prn inhaler  . De Quervain's tenosynovitis, left 04/2015  . Depression   . 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 Elevated WBC count   . GERD (gastroesophageal reflux disease)   . Obese   . TMJ syndrome     Past Surgical History:  Procedure Laterality Date  . CESAREAN SECTION N/A 04/16/2014   Procedure: CESAREAN SECTION;  Surgeon: Elveria Royals, MD;  Location: [facility];  Service: Obstetrics;  Laterality: N/A;  . DORSAL COMPARTMENT RELEASE Left 04/22/2015   Procedure: LEFT WRIST DEQUERVAIN'S RELEASE;  Surgeon: Milly Jakob, MD;  Location: [facility];  Service: Orthopedics;  Laterality: Left;  . STRABISMUS SURGERY     as an infant  . STRABISMUS SURGERY Right 06/22/2014   Procedure: REPAIR STRABISMUS RIGHT  EYE;  Surgeon: Derry Skill, MD;  Location: [facility];  Service: Ophthalmology;  Laterality: Right;  . UPPER GASTROINTESTINAL ENDOSCOPY  2009  . WISDOM TOOTH EXTRACTION      MEDS:   Current Outpatient Medications on File Prior to Visit  Medication Sig Dispense Refill  . albuterol (PROAIR HFA) 108 (90 Base) MCG/ACT inhaler Inhale 2 puffs into the lungs every 6 (six) hours as needed for wheezing or shortness of breath.    . ALPRAZolam (XANAX) 0.5 MG tablet Take 0.5 mg by mouth at bedtime as needed for anxiety.    . cetirizine (ZYRTEC) 10 MG tablet Take 10 mg by mouth at bedtime. Reported on 10/06/2015    . FLUoxetine (PROZAC) 40 MG capsule Take 80 mg by mouth at bedtime.     . montelukast (SINGULAIR) 10 MG tablet Take 10 mg by mouth at bedtime.    . Multiple Vitamin (MULTIVITAMIN WITH MINERALS) TABS tablet Take 1 tablet by mouth at bedtime.    Marland Kitchen omeprazole (PRILOSEC) 20 MG capsule Take 20 mg by mouth at bedtime.  0   No current facility-administered medications on file prior to visit.     ALLERGIES: Lexapro [escitalopram oxalate]; Pristiq [desvenlafaxine succinate er]; Adhesive [tape]; and Morphine and related  Family History  Problem Relation Age of Onset  . Cancer Father   . Kidney cancer Father   . Hypertension Father   . Heart disease Maternal Grandfather   .  Rheum arthritis Mother   . Asthma Paternal Grandmother   . Diabetes Paternal Grandmother   . Hypertension Paternal Grandmother   . Allergies Brother   . Rheum arthritis Maternal Grandmother   . Stroke Maternal Grandmother     SH:  Married, non smoker  Review of Systems  All other systems reviewed and are negative.   PHYSICAL EXAMINATION:    BP 120/60 (BP Location: Right Arm, Patient Position: Sitting, Cuff Size: Large)   Pulse 84   Resp 16   Ht 5\' 3"  (1.6 m)   Wt 251 lb 3.2 oz (113.9 kg)   LMP 08/13/2018 (Exact Date)   Breastfeeding Unknown   BMI 44.50 kg/m     General appearance: alert,  cooperative and appears stated age No physical exam performed today   Assessment: Recent pregnancy, chemical, now with normalized HCG RH negative   Plan: Pt knows to call with any concerns or changes in plans regarding pregnancy. Continue PNV.

## 2018-08-22 ENCOUNTER — Encounter: Payer: Self-pay | Admitting: Obstetrics & Gynecology

## 2019-03-01 HISTORY — PX: UMBILICAL HERNIA REPAIR: SHX196

## 2019-05-23 ENCOUNTER — Encounter: Payer: Self-pay | Admitting: Gynecology

## 2019-07-13 ENCOUNTER — Telehealth: Payer: Self-pay | Admitting: Obstetrics & Gynecology

## 2019-07-13 NOTE — Telephone Encounter (Signed)
Patient is interested in getting an IUD.

## 2019-07-13 NOTE — Telephone Encounter (Signed)
Spoke with patient. Patient recently separated, wants to discuss Paragard IUD. Denies any GYN complaints or concerns. Last AEX 04/15/17. Recommended updating AEX, can discuss IUD options at that time, patient agreeable.   AEX scheduled for 09/07/19 at 9:30am with Dr. Sabra Heck.   Routing to provider for final review. Patient is agreeable to disposition. Will close encounter.

## 2019-09-05 ENCOUNTER — Other Ambulatory Visit: Payer: Self-pay

## 2019-09-07 ENCOUNTER — Encounter: Payer: Self-pay | Admitting: Obstetrics & Gynecology

## 2019-09-07 ENCOUNTER — Ambulatory Visit (INDEPENDENT_AMBULATORY_CARE_PROVIDER_SITE_OTHER): Payer: PRIVATE HEALTH INSURANCE | Admitting: Obstetrics & Gynecology

## 2019-09-07 ENCOUNTER — Other Ambulatory Visit: Payer: Self-pay

## 2019-09-07 VITALS — BP 118/74 | HR 76 | Temp 97.7°F | Resp 12 | Ht 63.0 in | Wt 238.4 lb

## 2019-09-07 DIAGNOSIS — D72828 Other elevated white blood cell count: Secondary | ICD-10-CM | POA: Diagnosis not present

## 2019-09-07 DIAGNOSIS — Z01419 Encounter for gynecological examination (general) (routine) without abnormal findings: Secondary | ICD-10-CM

## 2019-09-07 DIAGNOSIS — D134 Benign neoplasm of liver: Secondary | ICD-10-CM | POA: Diagnosis not present

## 2019-09-07 DIAGNOSIS — Z3049 Encounter for surveillance of other contraceptives: Secondary | ICD-10-CM

## 2019-09-07 NOTE — Progress Notes (Signed)
39 y.o. G31P1001 Married White or Caucasian female here for annual exam. Patient states that the time between her periods have been longer, sometimes 6 weeks apart.  This started in September.  She and her spouse have separated but she is dating someone.  She is not currently SA but she is considering this and would like to consider options.  H/o hepatocellular adenoma.  Had side effects with Mirena IUD.  Copper IUD reviewed.     Patient's last menstrual period was 08/14/2019.          Sexually active: No.  The current method of family planning is none.    Exercising: No.  The patient does not participate in regular exercise at present. Smoker:  no  Health Maintenance: Pap:  04/15/17 Neg:neg HR HPV History of abnormal Pap:  no TDaP:  She is unsure of this Screening Labs: discussed today   reports that she has never smoked. She has never used smokeless tobacco. She reports current alcohol use. She reports that she does not use drugs.  Past Medical History:  Diagnosis Date  . Anxiety   . Asthma    prn inhaler  . De Quervain's tenosynovitis, left 04/2015  . Depression   . 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 Elevated WBC count   . GERD (gastroesophageal reflux disease)   . Obese   . TMJ syndrome     Past Surgical History:  Procedure Laterality Date  . CESAREAN SECTION N/A 04/16/2014   Procedure: CESAREAN SECTION;  Surgeon: Elveria Royals, MD;  Location: [facility];  Service: Obstetrics;  Laterality: N/A;  . DORSAL COMPARTMENT RELEASE Left 04/22/2015   Procedure: LEFT WRIST DEQUERVAIN'S RELEASE;  Surgeon: Milly Jakob, MD;  Location: [facility];  Service: Orthopedics;  Laterality: Left;  . STRABISMUS SURGERY     as an infant  . STRABISMUS SURGERY Right 06/22/2014   Procedure: REPAIR STRABISMUS RIGHT EYE;  Surgeon: Derry Skill, MD;  Location: [facility];  Service: Ophthalmology;  Laterality: Right;  . UMBILICAL HERNIA REPAIR  03/2019  . UPPER  GASTROINTESTINAL ENDOSCOPY  2009  . WISDOM TOOTH EXTRACTION      Current Outpatient Medications  Medication Sig Dispense Refill  . albuterol (PROAIR HFA) 108 (90 Base) MCG/ACT inhaler Inhale 2 puffs into the lungs every 6 (six) hours as needed for wheezing or shortness of breath.    . ALPRAZolam (XANAX) 0.5 MG tablet Take 0.5 mg by mouth at bedtime as needed for anxiety.    . cetirizine (ZYRTEC) 10 MG tablet Take 10 mg by mouth at bedtime. Reported on 10/06/2015    . FLUoxetine (PROZAC) 40 MG capsule Take 80 mg by mouth at bedtime.     . montelukast (SINGULAIR) 10 MG tablet Take 10 mg by mouth at bedtime.    . Multiple Vitamin (MULTIVITAMIN WITH MINERALS) TABS tablet Take 1 tablet by mouth at bedtime.    Marland Kitchen omeprazole (PRILOSEC) 20 MG capsule Take 20 mg by mouth at bedtime.  0  . traZODone (DESYREL) 50 MG tablet      No current facility-administered medications for this visit.    Family History  Problem Relation Age of Onset  . Cancer Father   . Kidney cancer Father   . Hypertension Father   . Heart disease Maternal Grandfather   . Rheum arthritis Mother   . Asthma Paternal Grandmother   . Diabetes Paternal Grandmother   . Hypertension Paternal Grandmother   . Allergies Brother   .  Rheum arthritis Maternal Grandmother   . Stroke Maternal Grandmother     Review of Systems  All other systems reviewed and are negative.   Exam:   BP 118/74 (BP Location: Left Arm, Patient Position: Sitting, Cuff Size: Large)   Pulse 76   Temp 97.7 F (36.5 C) (Temporal)   Resp 12   Ht 5\' 3"  (1.6 m)   Wt 238 lb 6.4 oz (108.1 kg)   LMP 08/14/2019   BMI 42.23 kg/m    Height: 5\' 3"  (160 cm)  Ht Readings from Last 3 Encounters:  09/07/19 5\' 3"  (1.6 m)  08/19/18 5\' 3"  (1.6 m)  08/17/18 5\' 3"  (1.6 m)    General appearance: alert, cooperative and appears stated age Head: Normocephalic, without obvious abnormality, atraumatic Neck: no adenopathy, supple, symmetrical, trachea midline and thyroid  normal to inspection and palpation Lungs: clear to auscultation bilaterally Breasts: normal appearance, no masses or tenderness Heart: regular rate and rhythm Abdomen: soft, non-tender; bowel sounds normal; no masses,  no organomegaly Extremities: extremities normal, atraumatic, no cyanosis or edema Skin: Skin color, texture, turgor normal. No rashes or lesions Lymph nodes: Cervical, supraclavicular, and axillary nodes normal. No abnormal inguinal nodes palpated Neurologic: Grossly normal   Pelvic: External genitalia:  no lesions              Urethra:  normal appearing urethra with no masses, tenderness or lesions              Bartholins and Skenes: normal                 Vagina: normal appearing vagina with normal color and discharge, no lesions              Cervix: no lesions              Pap taken: No. Bimanual Exam:  Uterus:  normal size, contour, position, consistency, mobility, non-tender              Adnexa: normal adnexa and no mass, fullness, tenderness               Rectovaginal: Confirms               Anus:  normal sphincter tone, no lesions  Chaperone, Terence Lux, CMA, was present for exam.  A:  Well Woman with normal exam Desires contraception, options reviewed H/o hepatocellular adenoma.  No estrogens recommended.  Has undergone follow up and needs no further. H/O asthma and allergies H/o mildly elevated WBC ct  P:   Mammogram guidelines reviewed. pap smear with neg HR HPV 8/18.  New guidelines reviewed.  Not indicated today. Pt will return for paragard IUD placement.  She will call with onset of cycle Paper chart requested to check about HPV vaccination and last Tdap CBC with diff obtained today return annually or prn

## 2019-09-08 LAB — CBC WITH DIFFERENTIAL/PLATELET
Basophils Absolute: 0.1 10*3/uL (ref 0.0–0.2)
Basos: 1 %
EOS (ABSOLUTE): 0.4 10*3/uL (ref 0.0–0.4)
Eos: 4 %
Hematocrit: 36.1 % (ref 34.0–46.6)
Hemoglobin: 11.5 g/dL (ref 11.1–15.9)
Immature Grans (Abs): 0.1 10*3/uL (ref 0.0–0.1)
Immature Granulocytes: 1 %
Lymphocytes Absolute: 3 10*3/uL (ref 0.7–3.1)
Lymphs: 29 %
MCH: 27 pg (ref 26.6–33.0)
MCHC: 31.9 g/dL (ref 31.5–35.7)
MCV: 85 fL (ref 79–97)
Monocytes Absolute: 0.5 10*3/uL (ref 0.1–0.9)
Monocytes: 5 %
Neutrophils Absolute: 6.6 10*3/uL (ref 1.4–7.0)
Neutrophils: 60 %
Platelets: 439 10*3/uL (ref 150–450)
RBC: 4.26 x10E6/uL (ref 3.77–5.28)
RDW: 14.4 % (ref 11.7–15.4)
WBC: 10.6 10*3/uL (ref 3.4–10.8)

## 2019-09-11 ENCOUNTER — Telehealth: Payer: Self-pay | Admitting: Obstetrics & Gynecology

## 2019-09-11 NOTE — Telephone Encounter (Signed)
Spoke with patient. LMP 09/10/19, request to proceed with Paragard IUD insertion discussed at 09/07/19 AEX. IUD insertion scheduled for 1/12 at 4:30pm with Dr. Sabra Heck. [id number] prescreen negative, precautions reviewed.    Routing to provider for final review. Patient is agreeable to disposition. Will close encounter.   Cc: Lerry Liner, Magdalene Patricia

## 2019-09-11 NOTE — Telephone Encounter (Signed)
Patient has started her cycle and is ready to have her IUD inserted.

## 2019-09-11 NOTE — Telephone Encounter (Signed)
Call placed to patient to review benefit for a paraguard IUD. Patient acknowledges understanding of information presented. Patient will call at the onset of her cycle to schedule appointment. No further questions.  Forwarding to Dr Sabra Heck for final review. Patient is agreeable to disposition. Will close encounter.

## 2019-09-12 ENCOUNTER — Other Ambulatory Visit: Payer: Self-pay

## 2019-09-12 ENCOUNTER — Encounter: Payer: Self-pay | Admitting: Obstetrics & Gynecology

## 2019-09-12 ENCOUNTER — Ambulatory Visit (INDEPENDENT_AMBULATORY_CARE_PROVIDER_SITE_OTHER): Payer: PRIVATE HEALTH INSURANCE | Admitting: Obstetrics & Gynecology

## 2019-09-12 VITALS — BP 112/60 | HR 84 | Temp 97.9°F | Ht 63.0 in | Wt 237.0 lb

## 2019-09-12 DIAGNOSIS — Z01812 Encounter for preprocedural laboratory examination: Secondary | ICD-10-CM

## 2019-09-12 DIAGNOSIS — Z30430 Encounter for insertion of intrauterine contraceptive device: Secondary | ICD-10-CM

## 2019-09-12 DIAGNOSIS — Z3049 Encounter for surveillance of other contraceptives: Secondary | ICD-10-CM | POA: Diagnosis not present

## 2019-09-12 LAB — POCT URINE PREGNANCY: Preg Test, Ur: NEGATIVE

## 2019-09-12 NOTE — Progress Notes (Signed)
39 y.o. G29P1001 Separated Caucasian female presents for insertion of  Paragard IUD.  Pt has been counseled about alternative forms of contraception and feels non-hormonal IUD is the better option for her.  Pt has also been counseled about risks and benefits as well as complications.  Consent is obtained today.  All questions answered prior to start of procedure.   Current contraception: none but not currently SA Last STD testing:  10/18, no STD concerns LMP:  Patient's last menstrual period was 08/12/2019.  Patient Active Problem List   Diagnosis Date Noted  . Hepatocellular adenoma   . Adenoma of liver 02/08/2016  . Rhinitis, allergic 10/06/2015  . Recurrent sinusitis 10/06/2015  . Extrinsic asthma 11/11/2014  . H/O: depression 04/19/2014  . Cesarean delivery delivered 04/16/2014  . Hypermenorrhea 12/21/2012   Past Medical History:  Diagnosis Date  . Anxiety   . Asthma    prn inhaler  . De Quervain's tenosynovitis, left 04/2015  . Depression   . 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 Elevated WBC count   . GERD (gastroesophageal reflux disease)   . Hepatocellular adenoma   . Obese   . TMJ syndrome    Current Outpatient Medications on File Prior to Visit  Medication Sig Dispense Refill  . albuterol (PROAIR HFA) 108 (90 Base) MCG/ACT inhaler Inhale 2 puffs into the lungs every 6 (six) hours as needed for wheezing or shortness of breath.    . ALPRAZolam (XANAX) 0.5 MG tablet Take 0.5 mg by mouth at bedtime as needed for anxiety.    . cetirizine (ZYRTEC) 10 MG tablet Take 10 mg by mouth at bedtime. Reported on 10/06/2015    . FLUoxetine (PROZAC) 40 MG capsule Take 80 mg by mouth at bedtime.     . montelukast (SINGULAIR) 10 MG tablet Take 10 mg by mouth at bedtime.    . Multiple Vitamin (MULTIVITAMIN WITH MINERALS) TABS tablet Take 1 tablet by mouth at bedtime.    Marland Kitchen omeprazole (PRILOSEC) 20 MG capsule Take 20 mg by mouth at bedtime.  0  . traZODone (DESYREL) 50 MG tablet      No  current facility-administered medications on file prior to visit.   Lexapro [escitalopram oxalate], Pristiq [desvenlafaxine succinate er], Adhesive [tape], and Morphine and related  Review of Systems  All other systems reviewed and are negative.  Vitals:   09/12/19 1617  BP: 112/60  Pulse: 84  Temp: 97.9 F (36.6 C)  TempSrc: Temporal  Weight: 237 lb (107.5 kg)  Height: 5\' 3"  (1.6 m)    Gen:  WNWF healthy female NAD Abdomen: soft, non-tender Groin:  no inguinal nodes palpated  Pelvic exam: Vulva:  normal female genitalia Vagina:  normal vagina Cervix:  Non-tender, Negative CMT, no lesions or redness. Uterus:  normal shape, position and consistency   Procedure:  Speculum reinserted.  Cervix visualized and cleansed with Betadine x 3.  Paracervical block was not placed.  Single toothed tenaculum applied to anterior lip of cervix without difficulty.  Uterus sounded to 7cm.  Lot number: [id number].  Expiration: 02/2025.  Paragard IUD package was opened.  IUD and introducer passed to fundus and then withdrawn slightly before IUD was passed into endometrial cavity.  Introducer removed.  Strings cut to 2cm.  Tenaculum removed from cervix.  Minimal bleeding noted.  Pt tolerated the procedure well.  All instruments removed from vagina.  A: Insertion of  Paragard IUD Contraception desires  P:  Return for recheck 6-8 weeks Pt aware to  call for any concerns Pt aware removal due no later than 09/11/2029.  IUD card given to pt. She is going to continue the Gardisil series and will return for #2 and then #3 injections.

## 2019-09-18 ENCOUNTER — Telehealth: Payer: Self-pay | Admitting: Obstetrics & Gynecology

## 2019-09-18 NOTE — Telephone Encounter (Signed)
Left message to call Sharee Pimple, RN at [facility].   Paragard IUD placed 09/12/19, needs 6-8wk f/u

## 2019-09-18 NOTE — Telephone Encounter (Signed)
Patient says she need to schedule an appointment to check iud placement and hpv vaccine.

## 2019-09-22 ENCOUNTER — Telehealth: Payer: Self-pay

## 2019-09-22 NOTE — Telephone Encounter (Signed)
Tried calling patient to schedule for #2 and #3 Gardasil. No answer, left message for patient to call me back.

## 2019-09-28 NOTE — Telephone Encounter (Signed)
Patient called and scheduled #2 gardasil.

## 2019-09-29 ENCOUNTER — Ambulatory Visit (INDEPENDENT_AMBULATORY_CARE_PROVIDER_SITE_OTHER): Payer: PRIVATE HEALTH INSURANCE | Admitting: Obstetrics & Gynecology

## 2019-09-29 ENCOUNTER — Other Ambulatory Visit: Payer: Self-pay

## 2019-09-29 VITALS — BP 110/65 | HR 74 | Temp 97.5°F | Ht 63.0 in | Wt 238.8 lb

## 2019-09-29 DIAGNOSIS — Z23 Encounter for immunization: Secondary | ICD-10-CM

## 2019-09-29 NOTE — Progress Notes (Signed)
Patient in today for 2nd Gardasil injection.   Contraception: paragard IUD LMP: 09/10/2019 Last AEX: 09/07/2019  with SM  Injection given in Left Deltoid. Patient tolerated shot well.   Patient informed next injection due in about 4-6 months.   Routed to provider for final review.  Encounter closed.

## 2019-10-02 NOTE — Telephone Encounter (Signed)
Per review of Epic, patient received 2nd Gardasil on 09/29/19, scheduled for 3rd Gardasil on 01/30/20 and IUD recheck on 11/13/19.

## 2019-11-13 ENCOUNTER — Ambulatory Visit (INDEPENDENT_AMBULATORY_CARE_PROVIDER_SITE_OTHER): Payer: PRIVATE HEALTH INSURANCE | Admitting: Obstetrics & Gynecology

## 2019-11-13 ENCOUNTER — Encounter: Payer: Self-pay | Admitting: Obstetrics & Gynecology

## 2019-11-13 ENCOUNTER — Other Ambulatory Visit: Payer: Self-pay

## 2019-11-13 VITALS — BP 130/72 | HR 76 | Temp 97.7°F | Resp 12 | Wt 242.0 lb

## 2019-11-13 DIAGNOSIS — D134 Benign neoplasm of liver: Secondary | ICD-10-CM

## 2019-11-13 DIAGNOSIS — N926 Irregular menstruation, unspecified: Secondary | ICD-10-CM

## 2019-11-13 DIAGNOSIS — Z30431 Encounter for routine checking of intrauterine contraceptive device: Secondary | ICD-10-CM | POA: Diagnosis not present

## 2019-11-13 MED ORDER — NORETHINDRONE 0.35 MG PO TABS: 1.0000 | ORAL_TABLET | Freq: Every day | ORAL | 1 refills | Status: DC

## 2019-11-13 NOTE — Progress Notes (Signed)
GYNECOLOGY  VISIT  CC:   IUD recheck.  Reports significant, irregular bleeding since placement  HPI: 39 y.o. G2P1001 separate White or Caucasian female here for 6 week IUD check.  She is having irregular bleeding with her IUD since insertion.  There is no regularity to it at all.  This is really frustrating to her.  She has hepatocellular adenoma so estrogen should not be used.  She was trying to stay away from hormonal therapy but is not feeling very tolerant of the bleeding.  She is seeing someone and the relationship is good.  Is not interested in eliminating the possibility of future child bearing right now as we did discuss removal but tubal occlusion procedure.  She is just not ready for this right now.  Nexplanon, progestin IUDs discussed as well as POPs, depo provera, oral progestins.  Pt worried about weight gain with Depo Provera so not interested in this.  Concerned about progesterone side effects.  Decided to try micronor as this is easily reversible.  Pt aware this is 28 day continuous OCP and cycles may not be regular.  Discussed starting this before considering removal of Paragard.  Pt comfortable with this plan.  Denies SOB, palpitations.  Denies pain with intercourse.  Denies pelvic pain.  GYNECOLOGIC HISTORY: Patient's last menstrual period was 10/27/2019. Contraception: Paragard/Abstinence Menopausal hormone therapy: none  Patient Active Problem List   Diagnosis Date Noted  . Hepatocellular adenoma   . Gastroesophageal reflux disease 02/18/2018  . Adenoma of liver 02/08/2016  . Rhinitis, allergic 10/06/2015  . Recurrent sinusitis 10/06/2015  . Asthma 11/11/2014  . H/O: depression 04/19/2014  . Cesarean delivery delivered 04/16/2014  . Hypermenorrhea 12/21/2012  . Obesity (BMI 35.0-39.9 without comorbidity) 05/20/2012    Past Medical History:  Diagnosis Date  . Anxiety   . Asthma    prn inhaler  . De Quervain's tenosynovitis, left 04/2015  . Depression   . Duane's  syndrome of right eye 05/2014  . Elevated WBC count   . GERD (gastroesophageal reflux disease)   . Hepatocellular adenoma   . Obese   . TMJ syndrome     Past Surgical History:  Procedure Laterality Date  . CESAREAN SECTION N/A 04/16/2014   Procedure: CESAREAN SECTION;  Surgeon: Elveria Royals, MD;  Location: [facility];  Service: Obstetrics;  Laterality: N/A;  . DORSAL COMPARTMENT RELEASE Left 04/22/2015   Procedure: LEFT WRIST DEQUERVAIN'S RELEASE;  Surgeon: Milly Jakob, MD;  Location: [facility];  Service: Orthopedics;  Laterality: Left;  . STRABISMUS SURGERY     as an infant  . STRABISMUS SURGERY Right 06/22/2014   Procedure: REPAIR STRABISMUS RIGHT EYE;  Surgeon: Derry Skill, MD;  Location: [facility];  Service: Ophthalmology;  Laterality: Right;  . UMBILICAL HERNIA REPAIR  03/2019  . UPPER GASTROINTESTINAL ENDOSCOPY  2009  . WISDOM TOOTH EXTRACTION      MEDS:   Current Outpatient Medications on File Prior to Visit  Medication Sig Dispense Refill  . albuterol (PROAIR HFA) 108 (90 Base) MCG/ACT inhaler Inhale 2 puffs into the lungs every 6 (six) hours as needed for wheezing or shortness of breath.    . ALPRAZolam (XANAX) 0.5 MG tablet Take 0.5 mg by mouth at bedtime as needed for anxiety.    . cetirizine (ZYRTEC) 10 MG tablet Take 10 mg by mouth at bedtime. Reported on 10/06/2015    . FLUoxetine (PROZAC) 40 MG capsule Take 80 mg by mouth at bedtime.     Marland Kitchen  montelukast (SINGULAIR) 10 MG tablet Take 10 mg by mouth at bedtime.    . Multiple Vitamin (MULTIVITAMIN WITH MINERALS) TABS tablet Take 1 tablet by mouth at bedtime.    Marland Kitchen omeprazole (PRILOSEC) 20 MG capsule Take 20 mg by mouth at bedtime.  0  . traZODone (DESYREL) 50 MG tablet      No current facility-administered medications on file prior to visit.    ALLERGIES: Lexapro [escitalopram oxalate], Pristiq [desvenlafaxine succinate er], Adhesive [tape], and Morphine and related  Family History   Problem Relation Age of Onset  . Cancer Father   . Kidney cancer Father   . Hypertension Father   . Heart disease Maternal Grandfather   . Rheum arthritis Mother   . Asthma Paternal Grandmother   . Diabetes Paternal Grandmother   . Hypertension Paternal Grandmother   . Allergies Brother   . Rheum arthritis Maternal Grandmother   . Stroke Maternal Grandmother     SH:  Separated, non smoker  Review of Systems  All other systems reviewed and are negative.   PHYSICAL EXAMINATION:    BP 130/72 (BP Location: Left Arm, Patient Position: Sitting, Cuff Size: Normal)   Pulse 76   Temp 97.7 F (36.5 C) (Temporal)   Resp 12   Wt 242 lb (109.8 kg)   LMP 10/27/2019   BMI 42.87 kg/m     General appearance: alert, cooperative and appears stated age Lymph:  no inguinal LAD noted  Pelvic: External genitalia:  no lesions              Urethra:  normal appearing urethra with no masses, tenderness or lesions              Bartholins and Skenes: normal                 Vagina: normal appearing vagina with normal color and discharge, no lesions              Cervix: no lesions and 2.5cm IUD string noted              Bimanual Exam:  Uterus:  normal size, contour, position, consistency, mobility, non-tender              Adnexa: no mass, fullness, tenderness  Chaperone, Terence Lux, CMA, was present for exam.  Assessment: IUD recheck, using Paragard Irregular bleeding  Plan: Rx for micronor to pharmacy for 90 day supply with 3 RFs.  She will give me an update after a few months or earlier if has unacceptable side effects. Has AEX scheduled June, 2021, so if doing well will discuss further then and make plans regarding paragard IUD.

## 2019-11-22 ENCOUNTER — Encounter: Payer: Self-pay | Admitting: Certified Nurse Midwife

## 2019-12-14 ENCOUNTER — Other Ambulatory Visit: Payer: Self-pay

## 2019-12-14 ENCOUNTER — Encounter: Payer: Self-pay | Admitting: Obstetrics & Gynecology

## 2019-12-14 ENCOUNTER — Telehealth: Payer: Self-pay

## 2019-12-14 ENCOUNTER — Ambulatory Visit (INDEPENDENT_AMBULATORY_CARE_PROVIDER_SITE_OTHER): Payer: PRIVATE HEALTH INSURANCE | Admitting: Obstetrics & Gynecology

## 2019-12-14 VITALS — BP 122/76 | HR 88 | Temp 97.2°F | Ht 63.0 in | Wt 239.6 lb

## 2019-12-14 DIAGNOSIS — N898 Other specified noninflammatory disorders of vagina: Secondary | ICD-10-CM

## 2019-12-14 MED ORDER — FLUCONAZOLE 150 MG PO TABS: 150.0000 mg | ORAL_TABLET | Freq: Once | ORAL | 0 refills | Status: AC

## 2019-12-14 NOTE — Telephone Encounter (Signed)
Spoke with pt. Pt has Paragard IUD and using Micronor POPs for irregular bleeding that she started on 11/13/2019 per OV note.  Last AEX 09/07/19  Pt has had 2 Gardasil injections in series. Last one 09/29/2019 and 3rd scheduled for 01/30/20.   Pt reports having vaginal itching x 1-2 weeks. Pt denies pain, burning, vaginal discharge or odor, fever, chills.. Pt also requesting to have HPV testing since breaking up with BF recently and he had known HPV. Denies any outbreaks or lesions. Advised pt to have OV for evaluation. Pt agreeable. Pt scheduled for OV as work-in appt today 4/15 at 1:45 pm. Pt verbalized understanding. CPS Neg.   Routing to Dr Sabra Heck for review. Encounter closed.

## 2019-12-14 NOTE — Progress Notes (Signed)
GYNECOLOGY  VISIT  CC:   Vaginal itching  HPI: 39 y.o. G85P1001 Married White or Caucasian female here for vaginal itching x 2 weeks.  She is wondering if this is related to starting the micronor.  She didn't start this until her cycle so she is still in her first pack.    She just broke up with boyfriend.  He had visible genital warts.  She would like to have testing for genital warts.  The distinction between HR HPV and low risk HPV discussed as well as no vulvar testing available for low risk HPV.  Would not advise cervical testing for HR HPV.  Will continue to monitor with exams and pt knows to call with any vulvar skin changes for early diagnosis and treatment.   Patient states she has been having vaginal spotting for the past month with micronor.  This was started after Paragard IUD was placed.  Will need 3 months before knowing if this will regulate bleeding.  GYNECOLOGIC HISTORY: No LMP recorded. Contraception: Paragard IUD & micronor Menopausal hormone therapy: none  Patient Active Problem List   Diagnosis Date Noted  . Hepatocellular adenoma   . Gastroesophageal reflux disease 02/18/2018  . Adenoma of liver 02/08/2016  . Rhinitis, allergic 10/06/2015  . Recurrent sinusitis 10/06/2015  . Asthma 11/11/2014  . H/O: depression 04/19/2014  . Cesarean delivery delivered 04/16/2014  . Hypermenorrhea 12/21/2012  . Obesity (BMI 35.0-39.9 without comorbidity) 05/20/2012    Past Medical History:  Diagnosis Date  . Anxiety   . Asthma    prn inhaler  . De Quervain's tenosynovitis, left 04/2015  . Depression   . 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 Elevated WBC count   . GERD (gastroesophageal reflux disease)   . Hepatocellular adenoma   . Obese   . TMJ syndrome     Past Surgical History:  Procedure Laterality Date  . CESAREAN SECTION N/A 04/16/2014   Procedure: CESAREAN SECTION;  Surgeon: Elveria Royals, MD;  Location: [facility];  Service: Obstetrics;  Laterality: N/A;  .  DORSAL COMPARTMENT RELEASE Left 04/22/2015   Procedure: LEFT WRIST DEQUERVAIN'S RELEASE;  Surgeon: Milly Jakob, MD;  Location: [facility];  Service: Orthopedics;  Laterality: Left;  . STRABISMUS SURGERY     as an infant  . STRABISMUS SURGERY Right 06/22/2014   Procedure: REPAIR STRABISMUS RIGHT EYE;  Surgeon: Derry Skill, MD;  Location: [facility];  Service: Ophthalmology;  Laterality: Right;  . UMBILICAL HERNIA REPAIR  03/2019  . UPPER GASTROINTESTINAL ENDOSCOPY  2009  . WISDOM TOOTH EXTRACTION      MEDS:   Current Outpatient Medications on File Prior to Visit  Medication Sig Dispense Refill  . albuterol (PROAIR HFA) 108 (90 Base) MCG/ACT inhaler Inhale 2 puffs into the lungs every 6 (six) hours as needed for wheezing or shortness of breath.    . ALPRAZolam (XANAX) 0.5 MG tablet Take 0.5 mg by mouth at bedtime as needed for anxiety.    . cetirizine (ZYRTEC) 10 MG tablet Take 10 mg by mouth at bedtime. Reported on 10/06/2015    . FLUoxetine (PROZAC) 40 MG capsule Take 80 mg by mouth at bedtime.     . montelukast (SINGULAIR) 10 MG tablet Take 10 mg by mouth at bedtime.    . Multiple Vitamin (MULTIVITAMIN WITH MINERALS) TABS tablet Take 1 tablet by mouth at bedtime.    . norethindrone (MICRONOR) 0.35 MG tablet Take 1 tablet (0.35 mg total) by  mouth daily. 3 Package 1  . omeprazole (PRILOSEC) 20 MG capsule Take 20 mg by mouth at bedtime.  0  . traZODone (DESYREL) 50 MG tablet      No current facility-administered medications on file prior to visit.    ALLERGIES: Lexapro [escitalopram oxalate], Pristiq [desvenlafaxine succinate er], Adhesive [tape], and Morphine and related  Family History  Problem Relation Age of Onset  . Cancer Father   . Kidney cancer Father   . Hypertension Father   . Heart disease Maternal Grandfather   . Rheum arthritis Mother   . Asthma Paternal Grandmother   . Diabetes Paternal Grandmother   . Hypertension Paternal  Grandmother   . Allergies Brother   . Rheum arthritis Maternal Grandmother   . Stroke Maternal Grandmother     SH:  Divorced, non smoker  Review of Systems  Constitutional:       Vaginal itching/burning  All other systems reviewed and are negative.   PHYSICAL EXAMINATION:    BP 122/76 (Cuff Size: Large)   Pulse 88   Temp (!) 97.2 F (36.2 C) (Temporal)   Ht 5\' 3"  (1.6 m)   Wt 239 lb 9.6 oz (108.7 kg)   BMI 42.44 kg/m     General appearance: alert, cooperative and appears stated age Lymph:  no inguinal LAD noted  Pelvic: External genitalia:  no lesions              Urethra:  normal appearing urethra with no masses, tenderness or lesions              Bartholins and Skenes: normal                 Vagina: normal appearing vagina with normal color and whitish discharge, no lesions              Cervix: no lesions, IUD string noted              Bimanual Exam:  Uterus:  normal size, contour, position, consistency, mobility, non-tender              Adnexa: no mass, fullness, tenderness  Chaperone, Terence Lux, CMA, was present for exam.  Assessment: Vaginal itching (pt concerned may be due to the micronor)  Plan: Affirm Diflucan 150mg  po x 1, repeat in 72 hours

## 2019-12-14 NOTE — Telephone Encounter (Signed)
Patient called to schedule an appointment for HPV testing. Patient also stated she had vaginal itching.

## 2019-12-15 LAB — VAGINITIS/VAGINOSIS, DNA PROBE
Candida Species: NEGATIVE
Gardnerella vaginalis: NEGATIVE
Trichomonas vaginosis: NEGATIVE

## 2020-01-11 ENCOUNTER — Telehealth: Payer: Self-pay | Admitting: Obstetrics & Gynecology

## 2020-01-11 NOTE — Telephone Encounter (Signed)
AEX 09/07/19 OV 11/13/19 for irregular bleeding.  Neg Vaginitis testing on 12/14/19. POP- Micronor Rx 11/13/19 H/o Irregular cycles  H/o blood transfusion in 07/2018  Spoke with pt. Patient reports starting cycle on 12/31/19 and has continued to having heavy bleeding and clots. Pt states started out as regular cycle but has become heavier and with clots. Pt states changing maxi pad or super tampon intermittently  every 3-4 hours. Pt denies weakness, dizziness or lightheadedness. Pt does have headache that started with bleeding and some intermittent cramps. Denies pain.  Denies any OTC meds for headache.  Pt states stopped Micronor on 5/10 and had no bleeding on 5/11, then started having heavy bleeding and clots on 5/12 through today. Pt stopped Micronor per lab results on 12/14/19 because thought it was causing yeast sx. Pt was given Diflucan Rx on 12/14/19 but neg testing. Pt denies any vaginal itching or discharge, odor at this time.  Pt states had intermittent bleeding/spotting in April but not regular cycle. Pt had last LMP 3/23-3/28/21. Pt advised to be seen for further evaluation.  Pt agreeable. Pt scheduled with Dr Sabra Heck on 01/12/20 at 1pm per pt's request of date and time due to work schedule. Advised will review with Dr Sabra Heck and will return call to pt with any additional recommendations. Pt agreeable.   Routing to Dr Sabra Heck Encounter closed

## 2020-01-11 NOTE — Telephone Encounter (Signed)
Patient has been having a cycle for ten days and would like to see Dr.Miller.

## 2020-01-11 NOTE — Progress Notes (Signed)
GYNECOLOGY  VISIT  CC:   Irregular bleeding  HPI: 39 y.o. G2P1001 Separated White or Caucasian female here for increased bleeding since stopping POPs.  She stopped her POP on Monday.  Had no bleeding on Tues.  Had lighter bleeding on Wed, Thurs and Friday.  She did have some smaller clots on Wednesday.  She did have really heavy bleeding last week and was passing really heavy clots.  Pt has Paragard IUD and is aware bleeding may be heavier with this.  She does not want to have it removed at this time.  Not currently SA.  GYNECOLOGIC HISTORY: Patient's last menstrual period was 12/31/2019 (exact date). Contraception: paragard iud Menopausal hormone therapy: none upt-neg  Patient Active Problem List   Diagnosis Date Noted  . Hepatocellular adenoma   . Gastroesophageal reflux disease 02/18/2018  . Adenoma of liver 02/08/2016  . Rhinitis, allergic 10/06/2015  . Recurrent sinusitis 10/06/2015  . Asthma 11/11/2014  . H/O: depression 04/19/2014  . Cesarean delivery delivered 04/16/2014  . Hypermenorrhea 12/21/2012  . Obesity (BMI 35.0-39.9 without comorbidity) 05/20/2012    Past Medical History:  Diagnosis Date  . Anxiety   . Asthma    prn inhaler  . De Quervain's tenosynovitis, left 04/2015  . Depression   . 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 Elevated WBC count   . GERD (gastroesophageal reflux disease)   . Hepatocellular adenoma   . Obese   . TMJ syndrome     Past Surgical History:  Procedure Laterality Date  . CESAREAN SECTION N/A 04/16/2014   Procedure: CESAREAN SECTION;  Surgeon: Elveria Royals, MD;  Location: [facility];  Service: Obstetrics;  Laterality: N/A;  . DORSAL COMPARTMENT RELEASE Left 04/22/2015   Procedure: LEFT WRIST DEQUERVAIN'S RELEASE;  Surgeon: Milly Jakob, MD;  Location: [facility];  Service: Orthopedics;  Laterality: Left;  . STRABISMUS SURGERY     as an infant  . STRABISMUS SURGERY Right 06/22/2014   Procedure: REPAIR STRABISMUS  RIGHT EYE;  Surgeon: Derry Skill, MD;  Location: [facility];  Service: Ophthalmology;  Laterality: Right;  . UMBILICAL HERNIA REPAIR  03/2019  . UPPER GASTROINTESTINAL ENDOSCOPY  2009  . WISDOM TOOTH EXTRACTION      MEDS:   Current Outpatient Medications on File Prior to Visit  Medication Sig Dispense Refill  . albuterol (PROAIR HFA) 108 (90 Base) MCG/ACT inhaler Inhale 2 puffs into the lungs every 6 (six) hours as needed for wheezing or shortness of breath.    . ALPRAZolam (XANAX) 0.5 MG tablet Take 0.5 mg by mouth at bedtime as needed for anxiety.    . cetirizine (ZYRTEC) 10 MG tablet Take 10 mg by mouth at bedtime. Reported on 10/06/2015    . FLUoxetine (PROZAC) 40 MG capsule Take 80 mg by mouth at bedtime.     . montelukast (SINGULAIR) 10 MG tablet Take 10 mg by mouth at bedtime.    Marland Kitchen omeprazole (PRILOSEC) 20 MG capsule Take 20 mg by mouth at bedtime.  0  . traZODone (DESYREL) 50 MG tablet      No current facility-administered medications on file prior to visit.    ALLERGIES: Lexapro [escitalopram oxalate], Pristiq [desvenlafaxine succinate er], Adhesive [tape], and Morphine and related  Family History  Problem Relation Age of Onset  . Cancer Father   . Kidney cancer Father   . Hypertension Father   . Heart disease Maternal Grandfather   . Rheum arthritis Mother   . Asthma  Paternal Grandmother   . Diabetes Paternal Grandmother   . Hypertension Paternal Grandmother   . Allergies Brother   . Rheum arthritis Maternal Grandmother   . Stroke Maternal Grandmother     SH:  Separated, non smoker  Review of Systems  Constitutional: Negative.   HENT: Negative.   Eyes: Negative.   Respiratory: Negative.   Cardiovascular: Negative.   Gastrointestinal: Negative.   Endocrine: Negative.   Genitourinary:       Heavy vaginal bleeding  Musculoskeletal: Negative.   Skin: Negative.   Allergic/Immunologic: Negative.   Neurological: Negative.    Psychiatric/Behavioral: Negative.     PHYSICAL EXAMINATION:    BP 118/70   Pulse 68   Temp 98.1 F (36.7 C) (Skin)   Resp 16   Wt 242 lb (109.8 kg)   LMP 12/31/2019 (Exact Date)   BMI 42.87 kg/m     General appearance: alert, cooperative and appears stated age Lymph:  no inguinal LAD noted  Pelvic: External genitalia:  no lesions              Urethra:  normal appearing urethra with no masses, tenderness or lesions              Bartholins and Skenes: normal                 Vagina: normal appearing vagina with normal color and discharge, no lesions              Cervix: no lesions and IUD string noted              Bimanual Exam:  Uterus:  normal size, contour, position, consistency, mobility, non-tender              Adnexa: no mass, fullness, tenderness  Chaperone, Royal Hawthorn, CMA, was present for exam.  Assessment: Irregular bleeding on POPs with heavier bleeding after stopping, now improving Paraguard IUD H/O hepatic adenoma Exposure to genital warts, receiving HPV vaccination series  Plan: Pt aware estrogens not recommended Could consider mirena IUD if heavy bleeding continues.  For now, will just monitor and see how next cycle is.  She is not going to restart the micronor at this time Is already scheduled for third Gardisil vaccination  About 20 minutes total spent with pt

## 2020-01-12 ENCOUNTER — Other Ambulatory Visit: Payer: Self-pay

## 2020-01-12 ENCOUNTER — Ambulatory Visit (INDEPENDENT_AMBULATORY_CARE_PROVIDER_SITE_OTHER): Payer: PRIVATE HEALTH INSURANCE | Admitting: Obstetrics & Gynecology

## 2020-01-12 ENCOUNTER — Encounter: Payer: Self-pay | Admitting: Obstetrics & Gynecology

## 2020-01-12 VITALS — BP 118/70 | HR 68 | Temp 98.1°F | Resp 16 | Wt 242.0 lb

## 2020-01-12 DIAGNOSIS — Z30431 Encounter for routine checking of intrauterine contraceptive device: Secondary | ICD-10-CM | POA: Diagnosis not present

## 2020-01-12 DIAGNOSIS — Z23 Encounter for immunization: Secondary | ICD-10-CM | POA: Diagnosis not present

## 2020-01-12 DIAGNOSIS — N921 Excessive and frequent menstruation with irregular cycle: Secondary | ICD-10-CM | POA: Diagnosis not present

## 2020-01-12 DIAGNOSIS — D134 Benign neoplasm of liver: Secondary | ICD-10-CM | POA: Diagnosis not present

## 2020-01-12 LAB — POCT URINE PREGNANCY: Preg Test, Ur: NEGATIVE

## 2020-01-30 ENCOUNTER — Ambulatory Visit (INDEPENDENT_AMBULATORY_CARE_PROVIDER_SITE_OTHER): Payer: PRIVATE HEALTH INSURANCE

## 2020-01-30 ENCOUNTER — Other Ambulatory Visit: Payer: Self-pay

## 2020-01-30 VITALS — BP 114/80 | HR 88 | Temp 98.4°F | Ht 63.0 in | Wt 244.0 lb

## 2020-01-30 DIAGNOSIS — Z23 Encounter for immunization: Secondary | ICD-10-CM | POA: Diagnosis not present

## 2020-01-30 NOTE — Progress Notes (Signed)
Patient in today for 3rd Gardasil injection.   Contraception: Paragard IUD LMP: 01/01/20 Last AEX: 09/07/19 with SM  Injection given in RD. Patient tolerated shot well.   Patient has completed the HPV vaccination series.    Routed to provider for final review.  Encounter closed.

## 2020-02-08 ENCOUNTER — Encounter (HOSPITAL_BASED_OUTPATIENT_CLINIC_OR_DEPARTMENT_OTHER): Payer: Self-pay

## 2020-02-08 ENCOUNTER — Other Ambulatory Visit: Payer: Self-pay

## 2020-02-08 ENCOUNTER — Emergency Department (HOSPITAL_BASED_OUTPATIENT_CLINIC_OR_DEPARTMENT_OTHER)
Admission: EM | Admit: 2020-02-08 | Discharge: 2020-02-08 | Disposition: A | Discharge status: Home / Self Care | Payer: PRIVATE HEALTH INSURANCE | Attending: Emergency Medicine | Admitting: Emergency Medicine

## 2020-02-08 ENCOUNTER — Emergency Department (HOSPITAL_BASED_OUTPATIENT_CLINIC_OR_DEPARTMENT_OTHER): Payer: PRIVATE HEALTH INSURANCE

## 2020-02-08 DIAGNOSIS — R112 Nausea with vomiting, unspecified: Secondary | ICD-10-CM | POA: Insufficient documentation

## 2020-02-08 DIAGNOSIS — R1084 Generalized abdominal pain: Secondary | ICD-10-CM | POA: Insufficient documentation

## 2020-02-08 DIAGNOSIS — R05 Cough: Secondary | ICD-10-CM | POA: Diagnosis not present

## 2020-02-08 DIAGNOSIS — J45909 Unspecified asthma, uncomplicated: Secondary | ICD-10-CM | POA: Diagnosis not present

## 2020-02-08 DIAGNOSIS — R059 Cough, unspecified: Secondary | ICD-10-CM

## 2020-02-08 DIAGNOSIS — R197 Diarrhea, unspecified: Secondary | ICD-10-CM | POA: Diagnosis not present

## 2020-02-08 DIAGNOSIS — Z20822 Contact with and (suspected) exposure to covid-19: Secondary | ICD-10-CM | POA: Insufficient documentation

## 2020-02-08 LAB — LIPASE, BLOOD: Lipase: 20 U/L (ref 11–51)

## 2020-02-08 LAB — COMPREHENSIVE METABOLIC PANEL
ALT: 13 U/L (ref 0–44)
AST: 15 U/L (ref 15–41)
Albumin: 3.9 g/dL (ref 3.5–5.0)
Alkaline Phosphatase: 86 U/L (ref 38–126)
Anion gap: 12 (ref 5–15)
BUN: 9 mg/dL (ref 6–20)
CO2: 22 mmol/L (ref 22–32)
Calcium: 8.8 mg/dL — ABNORMAL LOW (ref 8.9–10.3)
Chloride: 102 mmol/L (ref 98–111)
Creatinine, Ser: 0.7 mg/dL (ref 0.44–1.00)
GFR calc Af Amer: >60 mL/min (ref >60)
GFR calc non Af Amer: >60 mL/min (ref >60)
Glucose, Bld: 121 mg/dL — ABNORMAL HIGH (ref 70–99)
Potassium: 3.8 mmol/L (ref 3.5–5.1)
Sodium: 136 mmol/L (ref 135–145)
Total Bilirubin: 0.8 mg/dL (ref 0.3–1.2)
Total Protein: 8 g/dL (ref 6.5–8.1)

## 2020-02-08 LAB — URINALYSIS, MICROSCOPIC (REFLEX)

## 2020-02-08 LAB — URINALYSIS, ROUTINE W REFLEX MICROSCOPIC
Bilirubin Urine: NEGATIVE
Glucose, UA: NEGATIVE mg/dL
Ketones, ur: NEGATIVE mg/dL
Leukocytes,Ua: NEGATIVE
Nitrite: NEGATIVE
Protein, ur: 100 mg/dL — AB
Specific Gravity, Urine: >1.03 — ABNORMAL HIGH (ref 1.005–1.030)
pH: 5.5 (ref 5.0–8.0)

## 2020-02-08 LAB — CBC
HCT: 40.5 % (ref 36.0–46.0)
Hemoglobin: 12.6 g/dL (ref 12.0–15.0)
MCH: 25.6 pg — ABNORMAL LOW (ref 26.0–34.0)
MCHC: 31.1 g/dL (ref 30.0–36.0)
MCV: 82.3 fL (ref 80.0–100.0)
Platelets: 522 K/uL — ABNORMAL HIGH (ref 150–400)
RBC: 4.92 MIL/uL (ref 3.87–5.11)
RDW: 13.8 % (ref 11.5–15.5)
WBC: 15.8 K/uL — ABNORMAL HIGH (ref 4.0–10.5)
nRBC: 0 % (ref 0.0–0.2)

## 2020-02-08 LAB — PREGNANCY, URINE: Preg Test, Ur: NEGATIVE

## 2020-02-08 MED ORDER — ACETAMINOPHEN 500 MG PO TABS
1000.0000 mg | ORAL_TABLET | Freq: Once | ORAL | Status: AC
  Administered 2020-02-08: 1000 mg via ORAL
  Filled 2020-02-08: qty 2

## 2020-02-08 MED ORDER — DICYCLOMINE HCL 20 MG PO TABS: 20.0000 mg | ORAL_TABLET | Freq: Three times a day (TID) | ORAL | 0 refills | Status: DC | PRN

## 2020-02-08 MED ORDER — ONDANSETRON 4 MG PO TBDP: 4.0000 mg | ORAL_TABLET | Freq: Three times a day (TID) | ORAL | 0 refills | Status: AC | PRN

## 2020-02-08 MED ORDER — DICYCLOMINE HCL 10 MG PO CAPS
10.0000 mg | ORAL_CAPSULE | Freq: Once | ORAL | Status: AC
  Administered 2020-02-08: 10 mg via ORAL
  Filled 2020-02-08: qty 1

## 2020-02-08 MED ORDER — SODIUM CHLORIDE 0.9 % IV BOLUS
1000.0000 mL | Freq: Once | INTRAVENOUS | Status: AC
  Administered 2020-02-08: 1000 mL via INTRAVENOUS

## 2020-02-08 MED ORDER — IOHEXOL 300 MG/ML  SOLN
100.0000 mL | Freq: Once | INTRAMUSCULAR | Status: AC | PRN
  Administered 2020-02-08: 100 mL via INTRAVENOUS

## 2020-02-08 MED ORDER — ONDANSETRON HCL 4 MG/2ML IJ SOLN
4.0000 mg | Freq: Once | INTRAMUSCULAR | Status: AC | PRN
  Administered 2020-02-08: 4 mg via INTRAVENOUS
  Filled 2020-02-08: qty 2

## 2020-02-08 MED ORDER — KETOROLAC TROMETHAMINE 30 MG/ML IJ SOLN
30.0000 mg | Freq: Once | INTRAMUSCULAR | Status: AC
  Administered 2020-02-08: 30 mg via INTRAVENOUS
  Filled 2020-02-08: qty 1

## 2020-02-08 NOTE — ED Notes (Signed)
CT waiting on results of preg test prior to imaging per [facility] radiology protocol

## 2020-02-08 NOTE — ED Triage Notes (Signed)
Pt c/o abd pain N/V/D that started last PM. Pt reports a fever of 100 at home. Pt reports associated fatigue, body aches, sore throat, cough.

## 2020-02-08 NOTE — Discharge Instructions (Signed)
You were seen in the emergency department today with abdominal pain, vomiting, diarrhea.  Your CT imaging and lab work show evidence of a likely viral infection.  Please follow closely with your primary care doctor.  I am sending medicines home to help with your symptoms.  Please take as directed.  I am also swabbing you for COVID-19.  The results will come back in the MyChart app.  There is information on this paperwork about how to set that up on your phone if you do not already have it.  Please remain in quarantine until these results come back and you are feeling better.

## 2020-02-08 NOTE — ED Provider Notes (Signed)
Emergency Department Provider Note   I have reviewed the triage vital signs and the nursing notes.   HISTORY  Chief Complaint Abdominal Pain   HPI Melissa May is a 39 y.o. female with past history reviewed below presents to the emergency department with abdominal pain with nausea, vomiting, diarrhea.  Symptoms began last night and have worsened throughout the day today.  She reports T-max of 100 F at home.  She has fatigue with body aches, sore throat.  Cough noted in triage note but patient denies this to me. No CP. No SOB symptoms. She has not been vaccinated against COVID 19. No blood in the emesis or stool. No sick contacts recently.    Past Medical History:  Diagnosis Date  . Anxiety   . Asthma    prn inhaler  . De Quervain's tenosynovitis, left 04/2015  . Depression   . 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 Elevated WBC count   . GERD (gastroesophageal reflux disease)   . Hepatocellular adenoma   . Obese   . TMJ syndrome     Patient Active Problem List   Diagnosis Date Noted  . Hepatocellular adenoma   . Gastroesophageal reflux disease 02/18/2018  . Adenoma of liver 02/08/2016  . Rhinitis, allergic 10/06/2015  . Recurrent sinusitis 10/06/2015  . Asthma 11/11/2014  . H/O: depression 04/19/2014  . Cesarean delivery delivered 04/16/2014  . Hypermenorrhea 12/21/2012  . Obesity (BMI 35.0-39.9 without comorbidity) 05/20/2012    Past Surgical History:  Procedure Laterality Date  . CESAREAN SECTION N/A 04/16/2014   Procedure: CESAREAN SECTION;  Surgeon: Elveria Royals, MD;  Location: [facility];  Service: Obstetrics;  Laterality: N/A;  . DORSAL COMPARTMENT RELEASE Left 04/22/2015   Procedure: LEFT WRIST DEQUERVAIN'S RELEASE;  Surgeon: Milly Jakob, MD;  Location: [facility];  Service: Orthopedics;  Laterality: Left;  . STRABISMUS SURGERY     as an infant  . STRABISMUS SURGERY Right 06/22/2014   Procedure: REPAIR STRABISMUS RIGHT EYE;  Surgeon:  Derry Skill, MD;  Location: [facility];  Service: Ophthalmology;  Laterality: Right;  . UMBILICAL HERNIA REPAIR  03/2019  . UPPER GASTROINTESTINAL ENDOSCOPY  2009  . WISDOM TOOTH EXTRACTION      Allergies Lexapro [escitalopram oxalate], Pristiq [desvenlafaxine succinate er], Adhesive [tape], and Morphine and related  Family History  Problem Relation Age of Onset  . Cancer Father   . Kidney cancer Father   . Hypertension Father   . Heart disease Maternal Grandfather   . Rheum arthritis Mother   . Asthma Paternal Grandmother   . Diabetes Paternal Grandmother   . Hypertension Paternal Grandmother   . Allergies Brother   . Rheum arthritis Maternal Grandmother   . Stroke Maternal Grandmother     Social History Social History   Tobacco Use  . Smoking status: Never Smoker  . Smokeless tobacco: Never Used  Vaping Use  . Vaping Use: Never used  Substance Use Topics  . Alcohol use: Yes    Comment: occ  . Drug use: No    Review of Systems  Constitutional: Positive fever/chills and body aches.  Eyes: No visual changes. ENT: Positive sore throat after vomiting.  Cardiovascular: Denies chest pain. Respiratory: Denies shortness of breath. Gastrointestinal: Positive abdominal pain. Positive nausea, vomiting, and diarrhea.  No constipation. Genitourinary: Negative for dysuria. Musculoskeletal: Negative for back pain. Skin: Negative for rash. Neurological: Negative for focal weakness or numbness. Positive HA.   10-point ROS otherwise  negative.  ____________________________________________   PHYSICAL EXAM:  VITAL SIGNS: ED Triage Vitals  Enc Vitals Group     BP 02/08/20 2007 133/90     Pulse Rate 02/08/20 2007 (!) 112     Resp 02/08/20 2007 20     Temp 02/08/20 2007 100.1 F (37.8 C)     Temp Source 02/08/20 2007 Oral     SpO2 02/08/20 2007 99 %     Weight 02/08/20 2008 245 lb (111.1 kg)     Height 02/08/20 2008 5\' 3"  (1.6 m)   Constitutional:  Alert and oriented. Well appearing and in no acute distress. Eyes: Conjunctivae are normal.  Head: Atraumatic. Nose: No congestion/rhinnorhea. Mouth/Throat: Mucous membranes are moist.   Neck: No stridor.   Cardiovascular: Tachycardia. Good peripheral circulation. Grossly normal heart sounds.   Respiratory: Normal respiratory effort.  No retractions. Lungs CTAB. Gastrointestinal: Soft with mild diffuse tenderness. No focal tenderness, rebound, or guarding. No distention.  Musculoskeletal: No lower extremity tenderness nor edema. No gross deformities of extremities. Neurologic:  Normal speech and language.  Skin:  Skin is warm, dry and intact. No rash noted.  ____________________________________________   LABS (all labs ordered are listed, but only abnormal results are displayed)  Labs Reviewed  COMPREHENSIVE METABOLIC PANEL - Abnormal; Notable for the following components:      Result Value   Glucose, Bld 121 (*)    Calcium 8.8 (*)    All other components within normal limits  CBC - Abnormal; Notable for the following components:   WBC 15.8 (*)    MCH 25.6 (*)    Platelets 522 (*)    All other components within normal limits  URINALYSIS, ROUTINE W REFLEX MICROSCOPIC - Abnormal; Notable for the following components:   APPearance CLOUDY (*)    Specific Gravity, Urine >1.030 (*)    Hgb urine dipstick TRACE (*)    Protein, ur 100 (*)    All other components within normal limits  URINALYSIS, MICROSCOPIC (REFLEX) - Abnormal; Notable for the following components:   Bacteria, UA FEW (*)    All other components within normal limits  SARS CORONAVIRUS 2 BY RT PCR (HOSPITAL ORDER, [facility])  LIPASE, BLOOD  PREGNANCY, URINE   ____________________________________________  RADIOLOGY  CT abdomen/pelvis reviewed.  ____________________________________________   PROCEDURES  Procedure(s) performed:   Procedures  None    ____________________________________________   INITIAL IMPRESSION / ASSESSMENT AND PLAN / ED COURSE  Pertinent labs & imaging results that were available during my care of the patient were reviewed by me and considered in my medical decision making (see chart for details).   Patient presents emergency department with nausea, vomiting, abdominal pain with borderline fever here.  Initial vitals with tachycardia but no hypotension.  No hypoxemia.  Seems most consistent with GI illness. Labs pending other than CBC which is significant for leukocytosis. Will reassess after treatment here prior to moving forward with imaging.   CMP and labs reviewed. No acute findings. CT abdomen pelvis with reviewed. Hepatic steatosis without other acute findings. Patient feeling better on reassessment. Suspect viral process. No abx. Will manage symptoms at home and discussed strict ED return precautions.  ____________________________________________  FINAL CLINICAL IMPRESSION(S) / ED DIAGNOSES  Final diagnoses:  Generalized abdominal pain  Nausea vomiting and diarrhea  Cough     MEDICATIONS GIVEN DURING THIS VISIT:  Medications  ondansetron (ZOFRAN) injection 4 mg (4 mg Intravenous Given 02/08/20 2038)  sodium chloride 0.9 % bolus 1,000  mL (0 mLs Intravenous Stopped 02/08/20 2228)  acetaminophen (TYLENOL) tablet 1,000 mg (1,000 mg Oral Given 02/08/20 2044)  dicyclomine (BENTYL) capsule 10 mg (10 mg Oral Given 02/08/20 2044)  ketorolac (TORADOL) 30 MG/ML injection 30 mg (30 mg Intravenous Given 02/08/20 2129)  iohexol (OMNIPAQUE) 300 MG/ML solution 100 mL (100 mLs Intravenous Contrast Given 02/08/20 2242)     NEW OUTPATIENT MEDICATIONS STARTED DURING THIS VISIT:  Discharge Medication List as of 02/08/2020 11:19 PM    START taking these medications   Details  dicyclomine (BENTYL) 20 MG tablet Take 1 tablet (20 mg total) by mouth 3 (three) times daily as needed for spasms (abdominal cramping)., Starting  Thu 02/08/2020, Normal    ondansetron (ZOFRAN ODT) 4 MG disintegrating tablet Take 1 tablet (4 mg total) by mouth every 8 (eight) hours as needed., Starting Thu 02/08/2020, Normal        Note:  This document was prepared using Dragon voice recognition software and may include unintentional dictation errors.  Nanda Quinton, MD, [facility] Emergency Medicine    Melissa May, [facility], MD 02/10/20 1524

## 2020-02-09 LAB — SARS CORONAVIRUS 2 BY RT PCR (HOSPITAL ORDER, PERFORMED IN ~~LOC~~ HOSPITAL LAB): SARS Coronavirus 2: NEGATIVE

## 2020-07-09 ENCOUNTER — Encounter: Payer: Self-pay | Admitting: Obstetrics & Gynecology

## 2020-09-06 ENCOUNTER — Ambulatory Visit (INDEPENDENT_AMBULATORY_CARE_PROVIDER_SITE_OTHER): Payer: PRIVATE HEALTH INSURANCE

## 2020-09-06 ENCOUNTER — Encounter: Payer: Self-pay | Admitting: Emergency Medicine

## 2020-09-06 ENCOUNTER — Other Ambulatory Visit: Payer: Self-pay

## 2020-09-06 ENCOUNTER — Ambulatory Visit
Admission: EM | Admit: 2020-09-06 | Discharge: 2020-09-06 | Disposition: A | Discharge status: Home / Self Care | Payer: PRIVATE HEALTH INSURANCE

## 2020-09-06 DIAGNOSIS — J069 Acute upper respiratory infection, unspecified: Secondary | ICD-10-CM

## 2020-09-06 DIAGNOSIS — R0789 Other chest pain: Secondary | ICD-10-CM | POA: Diagnosis not present

## 2020-09-06 MED ORDER — NAPROXEN 500 MG PO TABS: 500.0000 mg | ORAL_TABLET | Freq: Two times a day (BID) | ORAL | 0 refills | Status: AC

## 2020-09-06 NOTE — ED Triage Notes (Signed)
Patient c/o LFT sided chest pain this morning.  Patient c/o SOB x 2 days.   Patient was treated for a Sinus infection "earlier this week". Patient was given a steroid and amoxicillin for the sinus infection.   Patient endorses that SOB has worsened today.   Patient was tested for COVID-19 and reported a negative test result.

## 2020-09-06 NOTE — Discharge Instructions (Signed)
Please complete course of antibiotic Begin prednisone course Continue inhaler as needed May try Naprosyn, do not take at the same time of prednisone as both may cause some stomach irritation-take with food Follow-up if not improving or worsening

## 2020-09-07 NOTE — ED Provider Notes (Signed)
[facility]    CSN: [id number] Arrival date & time: 09/06/20  1849      History   Chief Complaint Chief Complaint  Patient presents with  . Chest Pain  . Shortness of Breath    HPI Melissa May is a 40 y.o. female presenting today for evaluation of left-sided chest pain.  Patient reports over the past 2 to 3 weeks she has had sinus symptoms, earlier this week was given a prescription for amoxicillin, over the past 2 days has developed increased chest tightness with shortness of breath similar to an asthma flare and was provided prednisone which she has not started.  Using albuterol inhaler without relief.  Denies any wheezing.  Today developed pain in her left chest which is worse with inspiration as well as certain movements.  Denies any history of hypertension or diabetes.  She denies any leg pain or leg swelling.  Denies any prior DVT/PE.  Denies exogenous estrogen use.  Denies recent travel or immobilization.  Denies tobacco use.  HPI  Past Medical History:  Diagnosis Date  . Anxiety   . Asthma    prn inhaler  . De Quervain's tenosynovitis, left 04/2015  . Depression   . 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 Elevated WBC count   . GERD (gastroesophageal reflux disease)   . Hepatocellular adenoma   . Obese   . TMJ syndrome     Patient Active Problem List   Diagnosis Date Noted  . Hepatocellular adenoma   . Gastroesophageal reflux disease 02/18/2018  . Adenoma of liver 02/08/2016  . Rhinitis, allergic 10/06/2015  . Recurrent sinusitis 10/06/2015  . Asthma 11/11/2014  . H/O: depression 04/19/2014  . Cesarean delivery delivered 04/16/2014  . Hypermenorrhea 12/21/2012  . Obesity (BMI 35.0-39.9 without comorbidity) 05/20/2012    Past Surgical History:  Procedure Laterality Date  . CESAREAN SECTION N/A 04/16/2014   Procedure: CESAREAN SECTION;  Surgeon: Elveria Royals, MD;  Location: [facility];  Service: Obstetrics;  Laterality: N/A;  . DORSAL  COMPARTMENT RELEASE Left 04/22/2015   Procedure: LEFT WRIST DEQUERVAIN'S RELEASE;  Surgeon: Milly Jakob, MD;  Location: [facility];  Service: Orthopedics;  Laterality: Left;  . STRABISMUS SURGERY     as an infant  . STRABISMUS SURGERY Right 06/22/2014   Procedure: REPAIR STRABISMUS RIGHT EYE;  Surgeon: Derry Skill, MD;  Location: [facility];  Service: Ophthalmology;  Laterality: Right;  . UMBILICAL HERNIA REPAIR  03/2019  . UPPER GASTROINTESTINAL ENDOSCOPY  2009  . WISDOM TOOTH EXTRACTION      OB History    Gravida  2   Para  1   Term  1   Preterm      AB      Living  1     SAB      IAB      Ectopic      Multiple      Live Births  1            Home Medications    Prior to Admission medications   Medication Sig Start Date End Date Taking? Authorizing Provider  cetirizine (ZYRTEC) 10 MG tablet Take 10 mg by mouth at bedtime. Reported on 10/06/2015   Yes [provider]  FLUoxetine (PROZAC) 40 MG capsule Take 80 mg by mouth at bedtime.    Yes [provider]  meloxicam (MOBIC) 15 MG tablet Take 15 mg by mouth once.   Yes  [provider]  montelukast (SINGULAIR) 10 MG tablet Take 10 mg by mouth at bedtime.   Yes [provider]  naproxen (NAPROSYN) 500 MG tablet Take 1 tablet (500 mg total) by mouth 2 (two) times daily. 09/06/20  Yes Jeiry Birnbaum C, PA-C  omeprazole (PRILOSEC) 20 MG capsule Take 20 mg by mouth at bedtime. 05/10/17  Yes [provider]  traZODone (DESYREL) 50 MG tablet  04/15/19  Yes [provider]  albuterol (VENTOLIN HFA) 108 (90 Base) MCG/ACT inhaler Inhale 2 puffs into the lungs every 6 (six) hours as needed for wheezing or shortness of breath.    [provider]  ALPRAZolam Duanne Moron) 0.5 MG tablet Take 0.5 mg by mouth at bedtime as needed for anxiety.    [provider]  dicyclomine (BENTYL) 20 MG tablet Take 1 tablet (20 mg total) by mouth 3  (three) times daily as needed for spasms (abdominal cramping). 02/08/20   Long, Wonda Olds, MD  ondansetron (ZOFRAN ODT) 4 MG disintegrating tablet Take 1 tablet (4 mg total) by mouth every 8 (eight) hours as needed. 02/08/20   Long, Wonda Olds, MD    Family History Family History  Problem Relation Age of Onset  . Cancer Father   . Kidney cancer Father   . Hypertension Father   . Heart disease Maternal Grandfather   . Rheum arthritis Mother   . Asthma Paternal Grandmother   . Diabetes Paternal Grandmother   . Hypertension Paternal Grandmother   . Allergies Brother   . Rheum arthritis Maternal Grandmother   . Stroke Maternal Grandmother     Social History Social History   Tobacco Use  . Smoking status: Never Smoker  . Smokeless tobacco: Never Used  Vaping Use  . Vaping Use: Never used  Substance Use Topics  . Alcohol use: Yes    Comment: occ  . Drug use: No     Allergies   Lexapro [escitalopram oxalate], Pristiq [desvenlafaxine succinate er], Adhesive [tape], and Morphine and related   Review of Systems Review of Systems  Constitutional: Negative for activity change, appetite change, chills, fatigue and fever.  HENT: Negative for congestion, ear pain, rhinorrhea, sinus pressure, sore throat and trouble swallowing.   Eyes: Negative for discharge and redness.  Respiratory: Positive for shortness of breath. Negative for cough and chest tightness.   Cardiovascular: Positive for chest pain.  Gastrointestinal: Negative for abdominal pain, diarrhea, nausea and vomiting.  Musculoskeletal: Negative for myalgias.  Skin: Negative for rash.  Neurological: Negative for dizziness, light-headedness and headaches.     Physical Exam Triage Vital Signs ED Triage Vitals  Enc Vitals Group     BP 09/06/20 2039 131/82     Pulse Rate 09/06/20 2039 86     Resp 09/06/20 2039 20     Temp 09/06/20 2039 98.6 F (37 C)     Temp Source 09/06/20 2039 Oral     SpO2 09/06/20 2039 99 %      Weight 09/06/20 2025 240 lb (108.9 kg)     Height 09/06/20 2025 5\' 2"  (1.575 m)     Head Circumference --      Peak Flow --      Pain Score 09/06/20 2025 5     Pain Loc --      Pain Edu? --      Excl. in [place]? --    No data found.  Updated Vital Signs BP 131/82 (BP Location: Left Arm)   Pulse 86  Temp 98.6 F (37 C) (Oral)   Resp 20   Ht 5\' 2"  (1.575 m)   Wt 240 lb (108.9 kg)   LMP 09/06/2020   SpO2 99%   BMI 43.90 kg/m   Visual Acuity Right Eye Distance:   Left Eye Distance:   Bilateral Distance:    Right Eye Near:   Left Eye Near:    Bilateral Near:     Physical Exam Vitals and nursing note reviewed.  Constitutional:      Appearance: She is well-developed and well-nourished.     Comments: No acute distress  HENT:     Head: Normocephalic and atraumatic.     Ears:     Comments: Bilateral ears without tenderness to palpation of external auricle, tragus and mastoid, EAC's without erythema or swelling, TM's with good bony landmarks and cone of light. Non erythematous.     Nose: Nose normal.     Mouth/Throat:     Comments: Oral mucosa pink and moist, no tonsillar enlargement or exudate. Posterior pharynx patent and nonerythematous, no uvula deviation or swelling. Normal phonation. Eyes:     Conjunctiva/sclera: Conjunctivae normal.  Cardiovascular:     Rate and Rhythm: Normal rate.  Pulmonary:     Effort: Pulmonary effort is normal. No respiratory distress.     Comments: Breathing comfortably at rest, CTABL, no wheezing, rales or other adventitious sounds auscultated   Left anterior chest tenderness to palpation Abdominal:     General: There is no distension.  Musculoskeletal:        General: Normal range of motion.     Cervical back: Neck supple.     Comments: Bilateral lower legs symmetric without calf tenderness  Skin:    General: Skin is warm and dry.  Neurological:     Mental Status: She is alert and oriented to person, place, and time.  Psychiatric:         Mood and Affect: Mood and affect normal.      UC Treatments / Results  Labs (all labs ordered are listed, but only abnormal results are displayed) Labs Reviewed - No data to display  EKG   Radiology DG Chest 2 View  Result Date: 09/06/2020 CLINICAL DATA:  Upper respiratory symptoms. EXAM: CHEST - 2 VIEW COMPARISON:  None. FINDINGS: The heart size and mediastinal contours are within normal limits. Both lungs are clear. The visualized skeletal structures are unremarkable. IMPRESSION: No active cardiopulmonary disease. Electronically Signed   By: Dorise Bullion III M.D   On: 09/06/2020 21:26    Procedures Procedures (including critical care time)  Medications Ordered in UC Medications - No data to display  Initial Impression / Assessment and Plan / UC Course  I have reviewed the triage vital signs and the nursing notes.  Pertinent labs & imaging results that were available during my care of the patient were reviewed by me and considered in my medical decision making (see chart for details).     EKG normal sinus rhythm, chest x-ray unremarkable.  Suspect most likely chest wall inflammation causing chest discomfort today and recommending continued anti-inflammatories.  Encourage patient to start a course of prednisone which she was previously prescribed and continue to monitor breathing symptoms.  Discussed strict return precautions. Patient verbalized understanding and is agreeable with plan.  Final Clinical Impressions(s) / UC Diagnoses   Final diagnoses:  Chest wall pain     Discharge Instructions     Please complete course of antibiotic Begin prednisone course Continue inhaler  as needed May try Naprosyn, do not take at the same time of prednisone as both may cause some stomach irritation-take with food Follow-up if not improving or worsening    ED Prescriptions    Medication Sig Dispense Auth. Provider   naproxen (NAPROSYN) 500 MG tablet Take 1 tablet (500  mg total) by mouth 2 (two) times daily. 30 tablet Madeline Pho, [place] C, PA-C     PDMP not reviewed this encounter.   Janith Lima, [place] 09/07/20 [phone]

## 2021-04-14 ENCOUNTER — Encounter: Payer: Self-pay | Admitting: Nurse Practitioner

## 2021-04-14 ENCOUNTER — Ambulatory Visit (INDEPENDENT_AMBULATORY_CARE_PROVIDER_SITE_OTHER): Payer: No Typology Code available for payment source | Admitting: Nurse Practitioner

## 2021-04-14 ENCOUNTER — Other Ambulatory Visit: Payer: Self-pay

## 2021-04-14 VITALS — BP 124/78

## 2021-04-14 DIAGNOSIS — R300 Dysuria: Secondary | ICD-10-CM

## 2021-04-14 DIAGNOSIS — Z113 Encounter for screening for infections with a predominantly sexual mode of transmission: Secondary | ICD-10-CM | POA: Diagnosis not present

## 2021-04-14 DIAGNOSIS — N898 Other specified noninflammatory disorders of vagina: Secondary | ICD-10-CM | POA: Diagnosis not present

## 2021-04-14 LAB — WET PREP FOR TRICH, YEAST, CLUE

## 2021-04-14 NOTE — Progress Notes (Signed)
   Acute Office Visit  Subjective:    Patient ID: Melissa May, female    DOB: Oct 31, 1980, 40 y.o.   MRN: [id number]   HPI 40 y.o. presents today for vaginal itching, odor, and burning with urination that started a week and a half ago. She is on her menses today. Sexually active with long-term partner.    Review of Systems  Constitutional: Negative.   Genitourinary:  Positive for dysuria. Negative for frequency, hematuria, urgency, vaginal discharge and vaginal pain.       Vaginal itching, odor, irritation      Objective:    Physical Exam Constitutional:      Appearance: Normal appearance.  Genitourinary:    General: Normal vulva.     Vagina: Normal.     Cervix: Normal.    BP 124/78   LMP 04/13/2021  Wt Readings from Last 3 Encounters:  09/06/20 240 lb (108.9 kg)  02/08/20 245 lb (111.1 kg)  01/30/20 244 lb (110.7 kg)   Wet prep negative UA nitrate negative, leukocyte negative, 1+ blood, SG 1.025, yellow/clear. Microscopic: wbc 0-5, rbc 3-10 (on menses), few bacteria     Assessment & Plan:   Problem List Items Addressed This Visit   None Visit Diagnoses     Vaginal irritation    -  Primary   Relevant Orders   WET PREP FOR [place], YEAST, CLUE   SURESWAB CT/NG/T. vaginalis   Screen for STD (sexually transmitted disease)       Relevant Orders   SURESWAB CT/NG/T. vaginalis   Burning with urination       Relevant Orders   Urinalysis,Complete w/RFL Culture      Plan: Reassurance provided on normal wet prep, urinalysis, and exam. Gonorrhea/chlamydia/trich pending. She will continue to monitor and return if symptoms worsen or do not improve.      Tamela Gammon DNP, 12:43 PM 04/14/2021

## 2021-04-15 LAB — SURESWAB CT/NG/T. VAGINALIS
C. trachomatis RNA, TMA: NOT DETECTED
N. gonorrhoeae RNA, TMA: NOT DETECTED
Trichomonas vaginalis RNA: NOT DETECTED

## 2021-04-16 LAB — URINE CULTURE
MICRO NUMBER:: 12242915
Result:: NO GROWTH
SPECIMEN QUALITY:: ADEQUATE

## 2021-04-16 LAB — URINALYSIS, COMPLETE W/RFL CULTURE
Bilirubin Urine: NEGATIVE
Glucose, UA: NEGATIVE
Hyaline Cast: NONE SEEN /LPF
Ketones, ur: NEGATIVE
Leukocyte Esterase: NEGATIVE
Nitrites, Initial: NEGATIVE
Protein, ur: NEGATIVE
Specific Gravity, Urine: 1.025 (ref 1.001–1.035)
pH: 6 (ref 5.0–8.0)

## 2021-04-16 LAB — CULTURE INDICATED

## 2022-03-23 IMAGING — DX DG CHEST 2V
2 series · 2 of 2 positions shown · non-contrast
Comparison: None.

CLINICAL DATA: Upper respiratory symptoms.

EXAM:
CHEST - 2 VIEW

[chest pa]
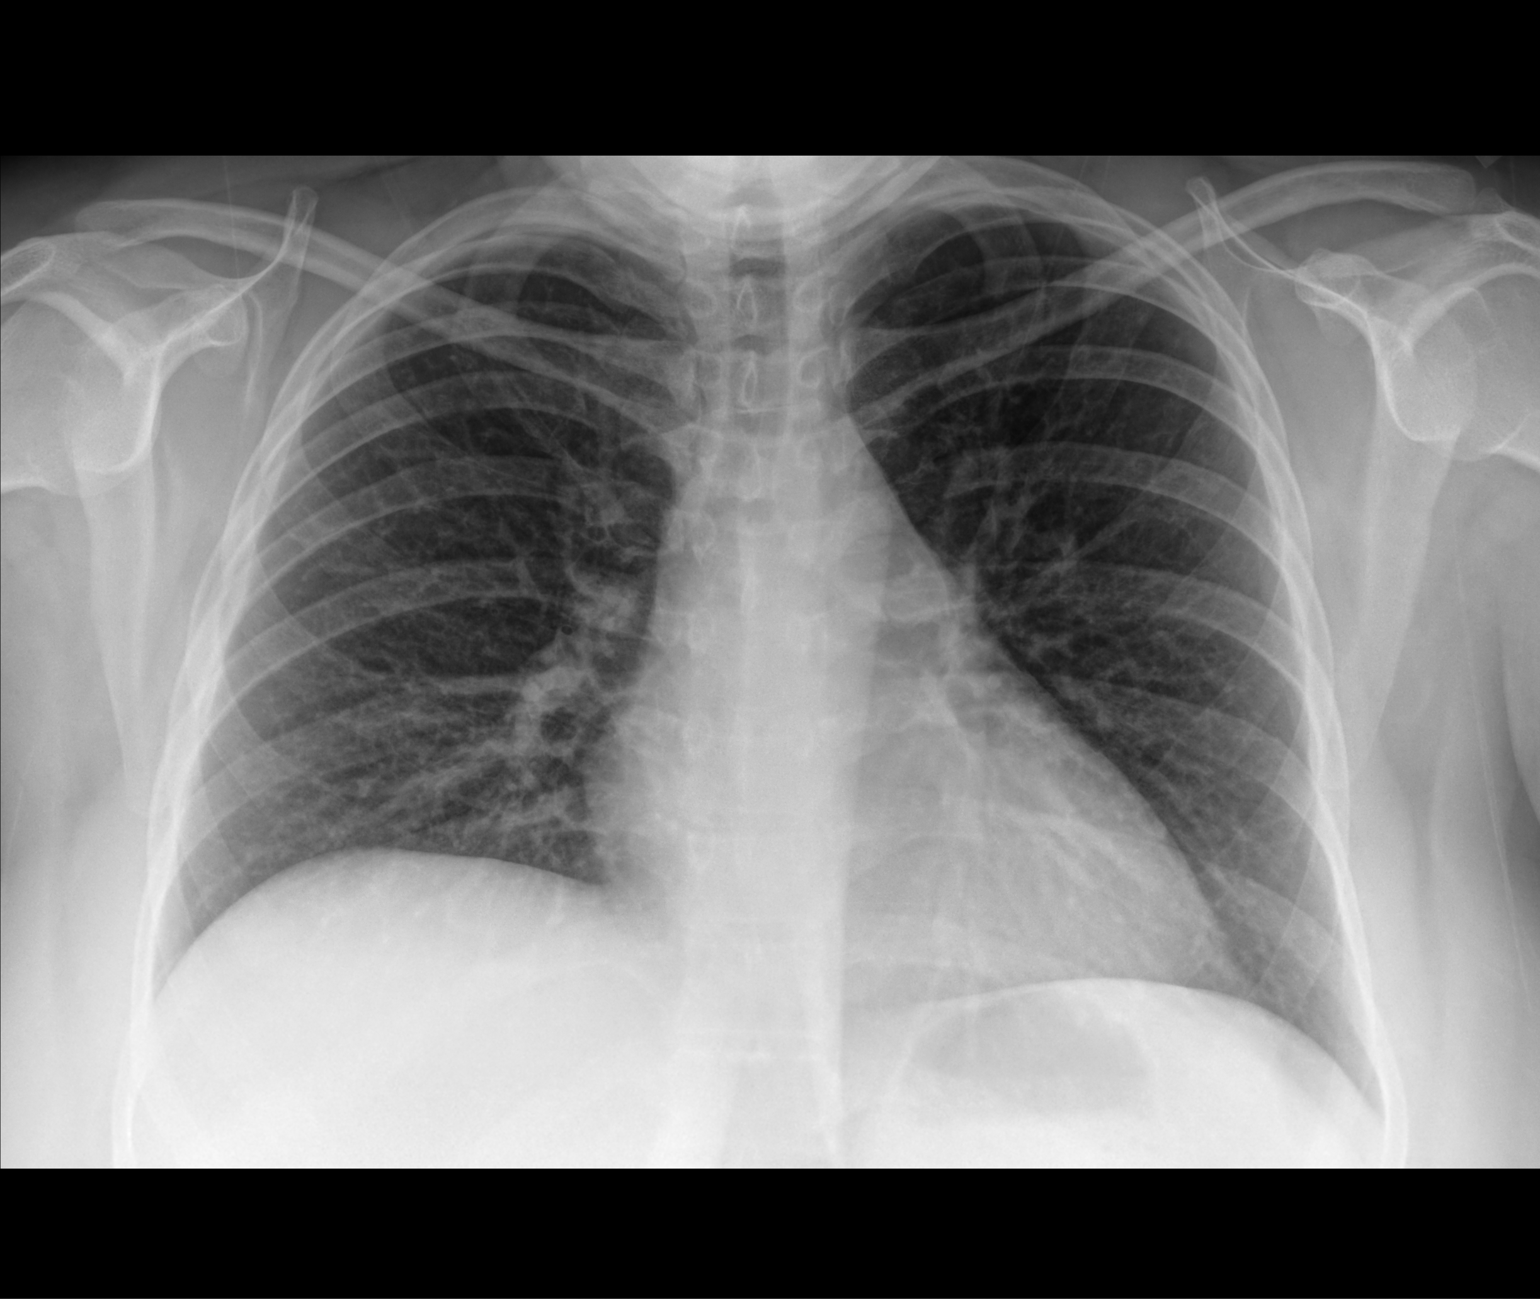

[chest lat]
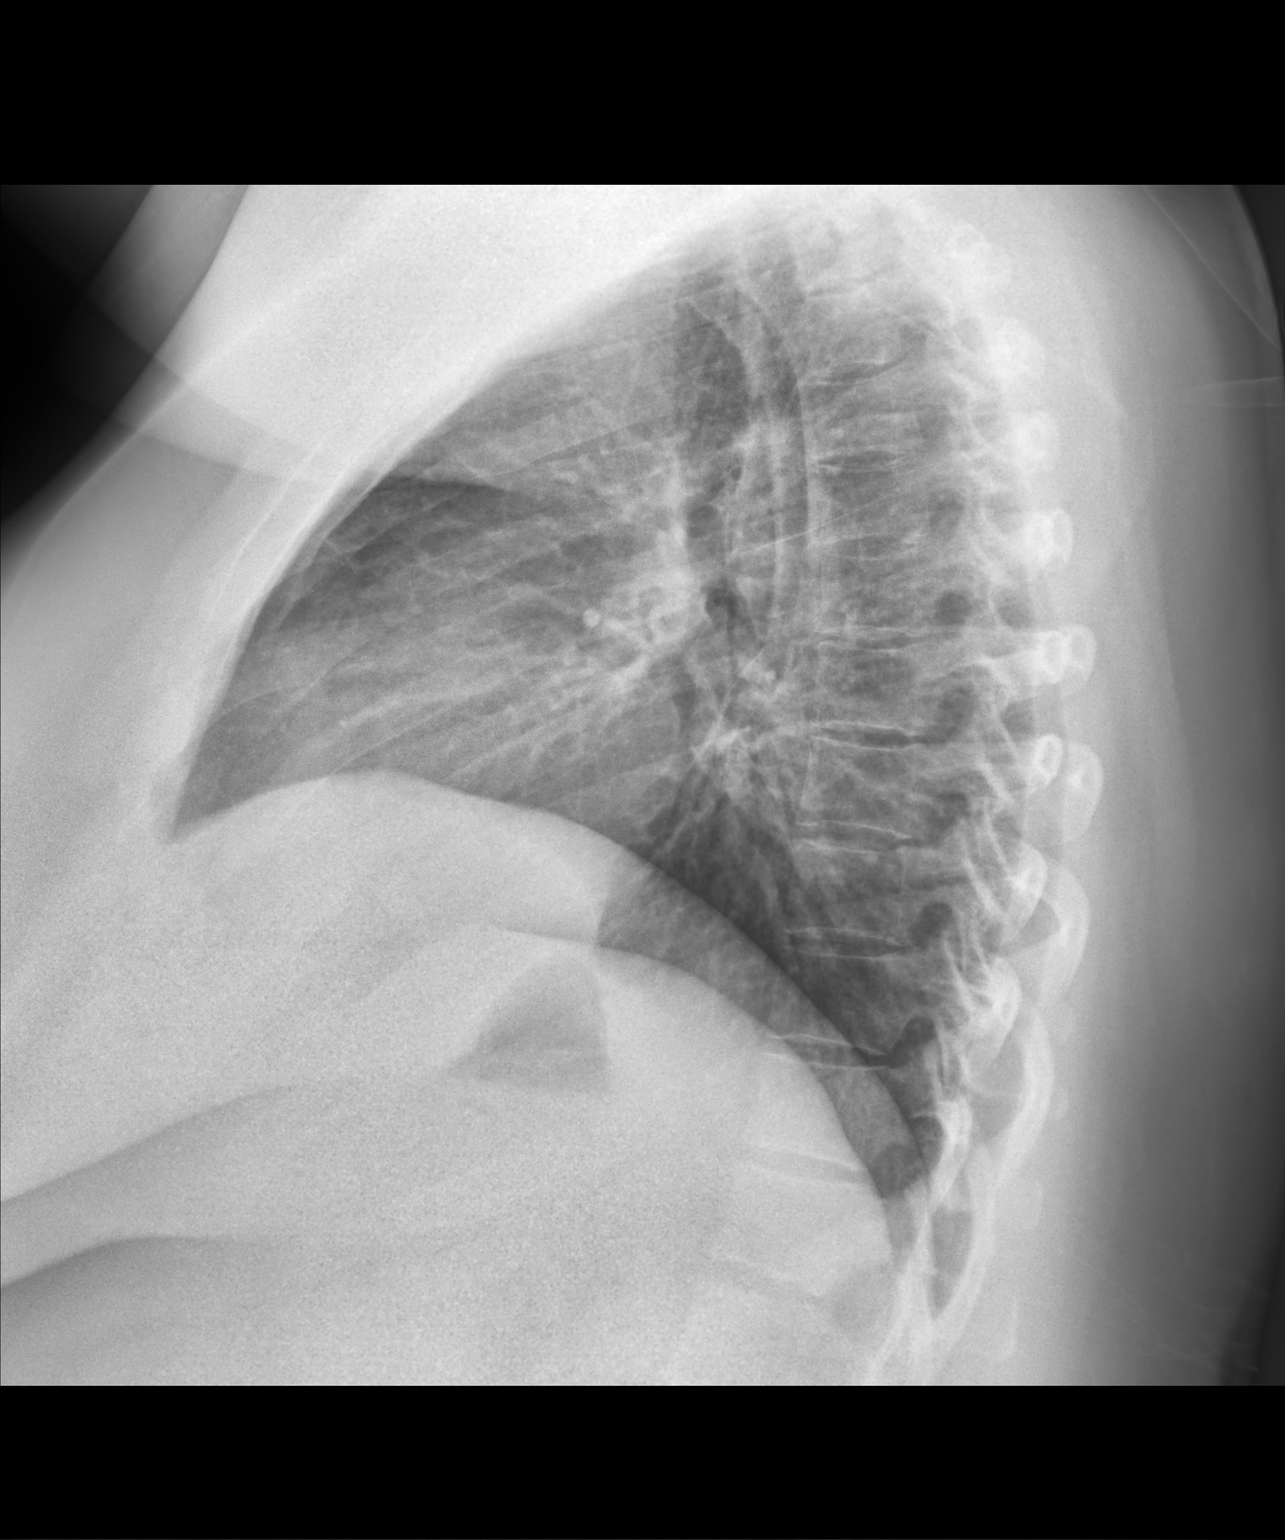

[2 of 2 positions shown; findings below may reference images not displayed]

FINDINGS: The heart size and mediastinal contours are within normal limits.
Both lungs are clear. The visualized skeletal structures are
unremarkable.
IMPRESSION: No active cardiopulmonary disease.

## 2022-11-07 ENCOUNTER — Emergency Department (HOSPITAL_BASED_OUTPATIENT_CLINIC_OR_DEPARTMENT_OTHER)
Admission: EM | Admit: 2022-11-07 | Discharge: 2022-11-08 | Disposition: A | Discharge status: Home / Self Care | Payer: PRIVATE HEALTH INSURANCE | Attending: Emergency Medicine | Admitting: Emergency Medicine

## 2022-11-07 ENCOUNTER — Other Ambulatory Visit: Payer: Self-pay

## 2022-11-07 ENCOUNTER — Encounter (HOSPITAL_BASED_OUTPATIENT_CLINIC_OR_DEPARTMENT_OTHER): Payer: Self-pay

## 2022-11-07 DIAGNOSIS — E871 Hypo-osmolality and hyponatremia: Secondary | ICD-10-CM | POA: Diagnosis not present

## 2022-11-07 DIAGNOSIS — R197 Diarrhea, unspecified: Secondary | ICD-10-CM | POA: Diagnosis not present

## 2022-11-07 DIAGNOSIS — R112 Nausea with vomiting, unspecified: Secondary | ICD-10-CM | POA: Diagnosis not present

## 2022-11-07 DIAGNOSIS — Z20822 Contact with and (suspected) exposure to covid-19: Secondary | ICD-10-CM | POA: Insufficient documentation

## 2022-11-07 DIAGNOSIS — D72829 Elevated white blood cell count, unspecified: Secondary | ICD-10-CM | POA: Insufficient documentation

## 2022-11-07 LAB — COMPREHENSIVE METABOLIC PANEL
ALT: 14 U/L (ref 0–44)
AST: 23 U/L (ref 15–41)
Albumin: 3.7 g/dL (ref 3.5–5.0)
Alkaline Phosphatase: 87 U/L (ref 38–126)
Anion gap: 10 (ref 5–15)
BUN: 10 mg/dL (ref 6–20)
CO2: 21 mmol/L — ABNORMAL LOW (ref 22–32)
Calcium: 8.8 mg/dL — ABNORMAL LOW (ref 8.9–10.3)
Chloride: 102 mmol/L (ref 98–111)
Creatinine, Ser: 0.82 mg/dL (ref 0.44–1.00)
GFR, Estimated: >60 mL/min (ref >60)
Glucose, Bld: 126 mg/dL — ABNORMAL HIGH (ref 70–99)
Potassium: 4.1 mmol/L (ref 3.5–5.1)
Sodium: 133 mmol/L — ABNORMAL LOW (ref 135–145)
Total Bilirubin: 0.6 mg/dL (ref 0.3–1.2)
Total Protein: 8.4 g/dL — ABNORMAL HIGH (ref 6.5–8.1)

## 2022-11-07 LAB — CBC WITH DIFFERENTIAL/PLATELET
Abs Immature Granulocytes: 0.07 10*3/uL (ref 0.00–0.07)
Basophils Absolute: 0.1 10*3/uL (ref 0.0–0.1)
Basophils Relative: 0 %
Eosinophils Absolute: 0.1 10*3/uL (ref 0.0–0.5)
Eosinophils Relative: 1 %
HCT: 41.2 % (ref 36.0–46.0)
Hemoglobin: 13.2 g/dL (ref 12.0–15.0)
Immature Granulocytes: 0 %
Lymphocytes Relative: 6 %
Lymphs Abs: 0.9 10*3/uL (ref 0.7–4.0)
MCH: 26.8 pg (ref 26.0–34.0)
MCHC: 32 g/dL (ref 30.0–36.0)
MCV: 83.7 fL (ref 80.0–100.0)
Monocytes Absolute: 0.5 10*3/uL (ref 0.1–1.0)
Monocytes Relative: 3 %
Neutro Abs: 15.3 10*3/uL — ABNORMAL HIGH (ref 1.7–7.7)
Neutrophils Relative %: 90 %
Platelets: 472 10*3/uL — ABNORMAL HIGH (ref 150–400)
RBC: 4.92 MIL/uL (ref 3.87–5.11)
RDW: 14.2 % (ref 11.5–15.5)
WBC: 17 10*3/uL — ABNORMAL HIGH (ref 4.0–10.5)
nRBC: 0 % (ref 0.0–0.2)

## 2022-11-07 LAB — RESP PANEL BY RT-PCR (RSV, FLU A&B, COVID)  RVPGX2
Influenza A by PCR: NEGATIVE
Influenza B by PCR: NEGATIVE
Resp Syncytial Virus by PCR: NEGATIVE
SARS Coronavirus 2 by RT PCR: NEGATIVE

## 2022-11-07 LAB — PREGNANCY, URINE: Preg Test, Ur: NEGATIVE

## 2022-11-07 MED ORDER — SODIUM CHLORIDE 0.9 % IV BOLUS
1000.0000 mL | Freq: Once | INTRAVENOUS | Status: AC
  Administered 2022-11-07: 1000 mL via INTRAVENOUS

## 2022-11-07 MED ORDER — KETOROLAC TROMETHAMINE 30 MG/ML IJ SOLN
15.0000 mg | Freq: Once | INTRAMUSCULAR | Status: AC
  Administered 2022-11-07: 15 mg via INTRAVENOUS
  Filled 2022-11-07: qty 1

## 2022-11-07 MED ORDER — ACETAMINOPHEN 325 MG PO TABS
650.0000 mg | ORAL_TABLET | Freq: Once | ORAL | Status: AC
  Administered 2022-11-08: 650 mg via ORAL
  Filled 2022-11-07: qty 2

## 2022-11-07 MED ORDER — ONDANSETRON HCL 4 MG/2ML IJ SOLN
4.0000 mg | Freq: Once | INTRAMUSCULAR | Status: AC
  Administered 2022-11-07: 4 mg via INTRAVENOUS
  Filled 2022-11-07: qty 2

## 2022-11-07 NOTE — ED Provider Notes (Signed)
[facility] Provider Note   CSN: [id number] Arrival date & time: 11/07/22  2019     History {Add pertinent medical, surgical, social history, OB history to HPI:1} Chief Complaint  Patient presents with   Vomiting    Melissa May is a 42 y.o. female.  Presents the ER today for "food poisoning".  She states that she woke up this morning had nausea vomiting diarrhea that lasted all day.  She is just not able to keep anything down.  Denies an hematemesis or hematochezia.  Describes diarrhea as watery.  Denies any known sick contacts, denies eating anything out of the ordinary.  She admits to body aches and chills as well.  She denies urinary symptoms.  HPI     Home Medications Prior to Admission medications   Medication Sig Start Date End Date Taking? Authorizing Provider  albuterol (VENTOLIN HFA) 108 (90 Base) MCG/ACT inhaler Inhale 2 puffs into the lungs every 6 (six) hours as needed for wheezing or shortness of breath.    [provider]  ALPRAZolam Duanne Moron) 0.5 MG tablet Take 0.5 mg by mouth at bedtime as needed for anxiety.    [provider]  dicyclomine (BENTYL) 20 MG tablet Take 1 tablet (20 mg total) by mouth 3 (three) times daily as needed for spasms (abdominal cramping). Patient not taking: Reported on 04/14/2021 02/08/20   Margette Fast, MD  FLUoxetine (PROZAC) 40 MG capsule Take 80 mg by mouth at bedtime.     [provider]  meloxicam (MOBIC) 15 MG tablet Take 15 mg by mouth once. Patient not taking: Reported on 04/14/2021    [provider]  montelukast (SINGULAIR) 10 MG tablet Take 10 mg by mouth at bedtime.    [provider]  naproxen (NAPROSYN) 500 MG tablet Take 1 tablet (500 mg total) by mouth 2 (two) times daily. Patient not taking: Reported on 04/14/2021 09/06/20   Wieters, Madelynn Done C, PA-C  omeprazole (PRILOSEC) 20 MG capsule Take 20 mg by mouth at bedtime. 05/10/17   [provider]  ondansetron (ZOFRAN ODT) 4 MG disintegrating tablet Take 1 tablet (4 mg total) by mouth every 8 (eight) hours as needed. Patient not taking: Reported on 04/14/2021 02/08/20   Margette Fast, MD  traZODone (DESYREL) 50 MG tablet  04/15/19   [provider]      Allergies    Lexapro [escitalopram oxalate], Pristiq [desvenlafaxine succinate er], Adhesive [tape], and Morphine and related    Review of Systems   Review of Systems  Physical Exam Updated Vital Signs BP (!) 156/105 (BP Location: Right Arm)   Pulse (!) 117   Temp 99 F (37.2 C) (Oral)   Resp 17   Ht '5\' 2"'$  (1.575 m)   Wt 117.9 kg   SpO2 96%   BMI 47.55 kg/m  Physical Exam Vitals and nursing note reviewed.  Constitutional:      General: She is not in acute distress.    Appearance: She is well-developed.  HENT:     Head: Normocephalic and atraumatic.     Mouth/Throat:     Mouth: Mucous membranes are moist.  Eyes:     Conjunctiva/sclera: Conjunctivae normal.  Cardiovascular:     Rate and Rhythm: Normal rate and regular rhythm.     Heart sounds: No murmur heard. Pulmonary:     Effort: Pulmonary effort is normal. No respiratory distress.     Breath sounds: Normal breath sounds.  Abdominal:     Palpations: Abdomen is soft.     Tenderness: There is no abdominal tenderness.  Musculoskeletal:        General: No swelling.     Cervical back: Neck supple.  Skin:    General: Skin is warm and dry.     Capillary Refill: Capillary refill takes less than 2 seconds.  Neurological:     General: No focal deficit present.     Mental Status: She is alert and oriented to person, place, and time.  Psychiatric:        Mood and Affect: Mood normal.     ED Results / Procedures / Treatments   Labs (all labs ordered are listed, but only abnormal results are displayed) Labs Reviewed  RESP PANEL BY RT-PCR (RSV, FLU A&B, COVID)  [id number]    EKG None  Radiology No results found.  Procedures Procedures   {Document cardiac monitor, telemetry assessment procedure when appropriate:1}  Medications Ordered in ED Medications - No data to display  ED Course/ Medical Decision Making/ A&P   {   Click here for ABCD2, HEART and other calculatorsREFRESH Note before signing :1}                          Medical Decision Making This patient presents to the ED for concern of nausea/vomiting and diarrhea without abdominal pain, this involves an extensive number of treatment options, and is a complaint that carries with it a high risk of complications and morbidity.  The differential diagnosis includes pharyngitis, foodborne illness, gastritis, diverticulitis, other   Co morbidities that complicate the patient evaluation  ***   Additional history obtained:  Additional history obtained from *** External records from outside source obtained and reviewed including ***   Lab Tests:  I Ordered, and personally interpreted labs.  The pertinent results include:  ***    Imaging Studies ordered:  I ordered imaging studies including ***  I independently visualized and interpreted imaging which showed *** I agree with the radiologist interpretation   Cardiac Monitoring: / EKG:  The patient was maintained on a cardiac monitor.  I personally viewed and interpreted the cardiac monitored which showed an underlying rhythm of: ***   Consultations Obtained:  I requested consultation with the ***,  and discussed lab and imaging findings as well as pertinent plan - they recommend: ***   Problem List / ED Course / Critical interventions / Medication management  *** I ordered medication including ***  for ***  Reevaluation of the patient after these medicines showed that the patient {resolved/improved/worsened:23923::"improved"} I have reviewed the patients home medicines and have made adjustments as needed   Social Determinants of Health:  ***   Test / Admission -  Considered:  ***     ***  {Document critical care time when appropriate:1} {Document review of labs and clinical decision tools ie heart score, Chads2Vasc2 etc:1}  {Document your independent review of radiology images, and any outside records:1} {Document your discussion with family members, caretakers, and with consultants:1} {Document social determinants of health affecting pt's care:1} {Document your decision making why or why not admission, treatments were needed:1} Final Clinical Impression(s) / ED Diagnoses Final diagnoses:  None    Rx / DC Orders ED Discharge Orders     None

## 2022-11-07 NOTE — ED Triage Notes (Signed)
Pt reports N/V/D starting this morning at 0800 upon waking. Pt reports symptoms came on suddenly and have developed to include generalized body aches and shaking. Unknown fever. Pt reports hx of asthma and admits to having her normal cough and SOB. Pt talking in full sentences and showing no acute respiratory distress.

## 2022-11-07 NOTE — ED Provider Notes (Incomplete)
[place] [facility]ERGENCY DEPARTMENT AT [facility] Provider Note   CSN: [id number] Arrival date & time: 11/07/22  2019     History {Add pertinent medical, surgical, social history, OB history to HPI:1} Chief Complaint  Patient presents with  . Vomiting    Melissa May is a 42 y.o. female.  Presents the ER today for "food poisoning".  She states that she woke up this morning had nausea vomiting diarrhea that lasted all day.  She is just not able to keep anything down.  Denies an hematemesis or hematochezia.  Describes diarrhea as watery.  Denies any known sick contacts, denies eating anything out of the ordinary.  She admits to body aches and chills as well.  She denies urinary symptoms.  HPI     Home Medications Prior to Admission medications   Medication Sig Start Date End Date Taking? Authorizing Provider  albuterol (VENTOLIN HFA) 108 (90 Base) MCG/ACT inhaler Inhale 2 puffs into the lungs every 6 (six) hours as needed for wheezing or shortness of breath.    [provider]  ALPRAZolam Duanne Moron) 0.5 MG tablet Take 0.5 mg by mouth at bedtime as needed for anxiety.    [provider]  dicyclomine (BENTYL) 20 MG tablet Take 1 tablet (20 mg total) by mouth 3 (three) times daily as needed for spasms (abdominal cramping). Patient not taking: Reported on 04/14/2021 02/08/20   Margette Fast, MD  FLUoxetine (PROZAC) 40 MG capsule Take 80 mg by mouth at bedtime.     [provider]  meloxicam (MOBIC) 15 MG tablet Take 15 mg by mouth once. Patient not taking: Reported on 04/14/2021    [provider]  montelukast (SINGULAIR) 10 MG tablet Take 10 mg by mouth at bedtime.    [provider]  naproxen (NAPROSYN) 500 MG tablet Take 1 tablet (500 mg total) by mouth 2 (two) times daily. Patient not taking: Reported on 04/14/2021 09/06/20   Wieters, Madelynn Done C, PA-C  omeprazole (PRILOSEC) 20 MG capsule Take 20 mg by mouth at bedtime. 05/10/17   [provider]  ondansetron (ZOFRAN ODT) 4 MG disintegrating tablet Take 1 tablet (4 mg total) by mouth every 8 (eight) hours as needed. Patient not taking: Reported on 04/14/2021 02/08/20   Margette Fast, MD  traZODone (DESYREL) 50 MG tablet  04/15/19   [provider]      Allergies    Lexapro [escitalopram oxalate], Pristiq [desvenlafaxine succinate er], Adhesive [tape], and Morphine and related    Review of Systems   Review of Systems  Physical Exam Updated Vital Signs BP (!) 156/105 (BP Location: Right Arm)   Pulse (!) 117   Temp 99 F (37.2 C) (Oral)   Resp 17   Ht '5\' 2"'$  (1.575 m)   Wt 117.9 kg   SpO2 96%   BMI 47.55 kg/m  Physical Exam Vitals and nursing note reviewed.  Constitutional:      General: She is not in acute distress.    Appearance: She is well-developed.  HENT:     Head: Normocephalic and atraumatic.     Mouth/Throat:     Mouth: Mucous membranes are moist.  Eyes:     Conjunctiva/sclera: Conjunctivae normal.  Cardiovascular:     Rate and Rhythm: Normal rate and regular rhythm.     Heart sounds: No murmur heard. Pulmonary:     Effort: Pulmonary effort is normal. No respiratory distress.     Breath sounds: Normal breath sounds.  Abdominal:     Palpations: Abdomen is soft.     Tenderness: There is no abdominal tenderness.  Musculoskeletal:        General: No swelling.     Cervical back: Neck supple.  Skin:    General: Skin is warm and dry.     Capillary Refill: Capillary refill takes less than 2 seconds.  Neurological:     General: No focal deficit present.     Mental Status: She is alert and oriented to person, place, and time.  Psychiatric:        Mood and Affect: Mood normal.     ED Results / Procedures / Treatments   Labs (all labs ordered are listed, but only abnormal results are displayed) Labs Reviewed  RESP PANEL BY RT-PCR (RSV, FLU A&B, COVID)  RVPGX2    EKG None  Radiology No results found.  Procedures Procedures   {Document cardiac monitor, telemetry assessment procedure when appropriate:1}  Medications Ordered in ED Medications - No data to display  ED Course/ Medical Decision Making/ A&P   {   Click here for ABCD2, HEART and other calculatorsREFRESH Note before signing :1}                          Medical Decision Making This patient presents to the ED for concern of nausea/vomiting and diarrhea without abdominal pain, this involves an extensive number of treatment options, and is a complaint that carries with it a high risk of complications and morbidity.  The differential diagnosis includes pharyngitis, foodborne illness, gastritis, diverticulitis, other      Additional history obtained:  Additional history obtained from *** External records from outside source obtained and reviewed including ***   Lab Tests:  I Ordered, and personally interpreted labs.  The pertinent results include:  ***    Imaging Studies ordered:  I ordered imaging studies including ***  I independently visualized and interpreted imaging which showed *** I agree with the radiologist interpretation   Cardiac Monitoring: / EKG:  The patient was maintained on a cardiac monitor.  I personally viewed and interpreted the cardiac monitored which showed an underlying rhythm of: ***   Consultations Obtained:  I requested consultation with the ***,  and discussed lab and imaging findings as well as pertinent plan - they recommend: ***   Problem List / ED Course / Critical interventions / Medication management  *** I ordered medication including ***  for ***  Reevaluation of the patient after these medicines showed that the patient {resolved/improved/worsened:23923::"improved"} I have reviewed the patients home medicines and have made adjustments as needed   Social Determinants of Health:  ***   Test / Admission - Considered:  ***    Amount and/or Complexity of Data Reviewed Labs:  ordered.  Risk OTC drugs. Prescription drug management.   ***  {Document critical care time when appropriate:1} {Document review of labs and clinical decision tools ie heart score, Chads2Vasc2 etc:1}  {Document your independent review of radiology images, and any outside records:1} {Document your discussion with family members, caretakers, and with consultants:1} {Document social determinants of health affecting pt's care:1} {Document your decision making why or why not admission, treatments were needed:1} Final Clinical Impression(s) / ED Diagnoses Final diagnoses:  None    Rx / DC Orders ED Discharge Orders     None

## 2022-11-08 LAB — URINALYSIS, ROUTINE W REFLEX MICROSCOPIC
Bilirubin Urine: NEGATIVE
Glucose, UA: NEGATIVE mg/dL
Hgb urine dipstick: NEGATIVE
Ketones, ur: NEGATIVE mg/dL
Leukocytes,Ua: NEGATIVE
Nitrite: NEGATIVE
Protein, ur: NEGATIVE mg/dL
Specific Gravity, Urine: >=1.03 (ref 1.005–1.030)
pH: 5.5 (ref 5.0–8.0)

## 2022-11-08 MED ORDER — ONDANSETRON 4 MG PO TBDP: 4.0000 mg | ORAL_TABLET | Freq: Four times a day (QID) | ORAL | 0 refills | Status: DC | PRN

## 2022-11-08 MED ORDER — ONDANSETRON 4 MG PO TBDP: 4.0000 mg | ORAL_TABLET | Freq: Four times a day (QID) | ORAL | 0 refills | Status: AC | PRN

## 2022-11-08 NOTE — Discharge Instructions (Signed)
You are seen today for nausea vomiting and diarrhea.  We are glad you are feeling better after the medications and fluids.  Oftentimes these things are caused by a virus which may be why you having bodyaches as well.  Your labs were overall reassuring.  Your white blood cell count was elevated today, though you do not have a fever, and you do not have any signs of infection.  This may be reactive from your vomiting.  We are sending you home with prescription for nausea medication.  Come back if you develop any new or worsening symptoms especially if you have persistent vomiting, abdominal pain or develop high fever.

## 2023-09-01 ENCOUNTER — Emergency Department (HOSPITAL_BASED_OUTPATIENT_CLINIC_OR_DEPARTMENT_OTHER): Payer: BC Managed Care – PPO

## 2023-09-01 ENCOUNTER — Emergency Department (HOSPITAL_BASED_OUTPATIENT_CLINIC_OR_DEPARTMENT_OTHER)
Admission: EM | Admit: 2023-09-01 | Discharge: 2023-09-02 | Disposition: A | Discharge status: Home / Self Care | Payer: BC Managed Care – PPO | Attending: Emergency Medicine | Admitting: Emergency Medicine

## 2023-09-01 ENCOUNTER — Other Ambulatory Visit: Payer: Self-pay

## 2023-09-01 ENCOUNTER — Encounter (HOSPITAL_BASED_OUTPATIENT_CLINIC_OR_DEPARTMENT_OTHER): Payer: Self-pay | Admitting: Emergency Medicine

## 2023-09-01 DIAGNOSIS — Z20822 Contact with and (suspected) exposure to covid-19: Secondary | ICD-10-CM | POA: Diagnosis not present

## 2023-09-01 DIAGNOSIS — R918 Other nonspecific abnormal finding of lung field: Secondary | ICD-10-CM | POA: Diagnosis not present

## 2023-09-01 DIAGNOSIS — J40 Bronchitis, not specified as acute or chronic: Secondary | ICD-10-CM | POA: Insufficient documentation

## 2023-09-01 DIAGNOSIS — J45909 Unspecified asthma, uncomplicated: Secondary | ICD-10-CM | POA: Diagnosis not present

## 2023-09-01 DIAGNOSIS — R0602 Shortness of breath: Secondary | ICD-10-CM | POA: Diagnosis not present

## 2023-09-01 LAB — RESP PANEL BY RT-PCR (RSV, FLU A&B, COVID)  RVPGX2
Influenza A by PCR: NEGATIVE
Influenza B by PCR: NEGATIVE
Resp Syncytial Virus by PCR: NEGATIVE
SARS Coronavirus 2 by RT PCR: NEGATIVE

## 2023-09-01 MED ORDER — ALBUTEROL SULFATE HFA 108 (90 BASE) MCG/ACT IN AERS
4.0000 | INHALATION_SPRAY | Freq: Once | RESPIRATORY_TRACT | Status: AC
  Administered 2023-09-01: 4 via RESPIRATORY_TRACT
  Filled 2023-09-01: qty 6.7

## 2023-09-01 MED ORDER — AEROCHAMBER PLUS FLO-VU LARGE MISC
1.0000 | Freq: Once | Status: AC
  Administered 2023-09-01: 1
  Filled 2023-09-01: qty 1

## 2023-09-01 MED ORDER — IPRATROPIUM-ALBUTEROL 0.5-2.5 (3) MG/3ML IN SOLN
3.0000 mL | RESPIRATORY_TRACT | Status: AC
  Administered 2023-09-01 (×2): 3 mL via RESPIRATORY_TRACT
  Filled 2023-09-01 (×2): qty 3

## 2023-09-01 NOTE — ED Triage Notes (Signed)
 Pt sts she was dx with sinus infection and bronchitis at [facility] on 12/26; she feels sxs are worse after course of abx; feels SHOB, NAD noted at this time

## 2023-09-01 NOTE — ED Provider Notes (Addendum)
 [place] [facility]ENC[facility]MENT AT [facility] Provider Note  CSN: [id number] Arrival date & time: 09/01/23 2214  Chief Complaint(s) Shortness of Breath  HPI Melissa May is a 43 y.o. female here for cough and shortness of breath for 1 week.  Diagnosed with bronchitis at urgent care 12/26, prescribed cough medicine and antibiotics.  Also completed steroid taper. Reports she does not feel better.  Has difficulty taking a breath due to coughing.  No overt chest pain.  No lower extremity swelling.  No recent travel.  No other physical complaints  The history is provided by the patient.    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   Elevated WBC count    GERD (gastroesophageal reflux disease)    Hepatocellular adenoma    Obese    TMJ syndrome    Patient Active Problem List   Diagnosis Date Noted   Hepatocellular adenoma    Gastroesophageal reflux disease 02/18/2018   Adenoma of liver 02/08/2016   Rhinitis, allergic 10/06/2015   Recurrent sinusitis 10/06/2015   Asthma 11/11/2014   H/O: depression 04/19/2014   Cesarean delivery delivered 04/16/2014   Hypermenorrhea 12/21/2012   Obesity (BMI 35.0-39.9 without comorbidity) 05/20/2012   Home Medication(s) Prior to Admission medications   Medication Sig Start Date End Date Taking? Authorizing Provider  azithromycin  (ZITHROMAX  Z-PAK) 250 MG tablet Take 500 mg on day 1, then 250 mg once a day for days 2, 3, 4, and 5. 09/02/23  Yes Nickholas Goldston, Raynell Moder, MD  dextromethorphan-guaiFENesin  (MUCINEX  DM) 30-600 MG 12hr tablet Take 1 tablet by mouth 2 (two) times daily for 7 days. 09/02/23 09/09/23 Yes Makhia Vosler, Raynell Moder, MD  ipratropium-albuterol  (DUONEB) 0.5-2.5 (3) MG/3ML SOLN Take 3 mLs by nebulization every 4 (four) hours as needed for up to 15 days. 09/02/23 09/17/23 Yes Rosealyn Little,  Raynell Moder, MD  albuterol  (VENTOLIN  HFA) 108 (90 Base) MCG/ACT inhaler Inhale 2 puffs into the lungs every 6 (six) hours as needed for wheezing or shortness of breath.    [provider]  ALPRAZolam (XANAX) 0.5 MG tablet Take 0.5 mg by mouth at bedtime as needed for anxiety.    [provider]  dicyclomine  (BENTYL ) 20 MG tablet Take 1 tablet (20 mg total) by mouth 3 (three) times daily as needed for spasms (abdominal cramping). Patient not taking: Reported on 04/14/2021 02/08/20   Darra Fonda MATSU, MD  FLUoxetine  (PROZAC ) 40 MG capsule Take 80 mg by mouth at bedtime.     [provider]  meloxicam (MOBIC) 15 MG tablet Take 15 mg by mouth once. Patient not taking: Reported on 04/14/2021    [provider]  montelukast  (SINGULAIR ) 10 MG tablet Take 10 mg by mouth at bedtime.    [provider]  naproxen  (NAPROSYN ) 500 MG tablet Take 1 tablet (500 mg total) by mouth 2 (two) times daily. Patient not taking: Reported on 04/14/2021 09/06/20   Wieters, Hallie C, PA-C  omeprazole (PRILOSEC) 20 MG capsule Take 20 mg by mouth at bedtime. 05/10/17   [provider]  ondansetron  (ZOFRAN  ODT) 4 MG disintegrating tablet Take 1 tablet (4 mg total) by mouth every 8 (eight) hours as needed. Patient not taking: Reported on 04/14/2021 02/08/20   Darra Fonda MATSU, MD  ondansetron  (ZOFRAN -ODT) 4 MG disintegrating tablet Take 1  tablet (4 mg total) by mouth every 6 (six) hours as needed for nausea or vomiting. 11/08/22   Suellen Cantor A, PA-C  traZODone (DESYREL) 50 MG tablet  04/15/19   [provider]                                                                                                                                    Allergies Lexapro [escitalopram oxalate], Pristiq [desvenlafaxine succinate er], Adhesive [tape], and Morphine  and codeine  Review of Systems Review of Systems As noted in HPI  Physical Exam Vital Signs  I have reviewed the triage  vital signs BP (!) 137/92 (BP Location: Right Arm)   Pulse 85   Temp 98.3 F (36.8 C)   Resp 20   Ht 5' 2 (1.575 m)   Wt 109.8 kg   LMP 08/11/2023   SpO2 94%   BMI 44.26 kg/m   Physical Exam Vitals reviewed.  Constitutional:      General: She is not in acute distress.    Appearance: She is well-developed. She is obese. She is not diaphoretic.  HENT:     Head: Normocephalic and atraumatic.     Nose: Nose normal.  Eyes:     General: No scleral icterus.       Right eye: No discharge.        Left eye: No discharge.     Conjunctiva/sclera: Conjunctivae normal.     Pupils: Pupils are equal, round, and reactive to light.  Cardiovascular:     Rate and Rhythm: Normal rate and regular rhythm.     Heart sounds: No murmur heard.    No friction rub. No gallop.  Pulmonary:     Effort: Pulmonary effort is normal. No respiratory distress.     Breath sounds: No stridor. Wheezing (inspiratory) present. No rales.  Abdominal:     General: There is no distension.     Palpations: Abdomen is soft.     Tenderness: There is no abdominal tenderness.  Musculoskeletal:        General: No tenderness.     Cervical back: Normal range of motion and neck supple.  Skin:    General: Skin is warm and dry.     Findings: No erythema or rash.  Neurological:     Mental Status: She is alert and oriented to person, place, and time.     ED Results and Treatments Labs (all labs ordered are listed, but only abnormal results are displayed) Labs Reviewed  RESP PANEL BY RT-PCR (RSV, FLU A&B, COVID)  RVPGX2  EKG  EKG Interpretation Date/Time:  Wednesday September 01 2023 22:30:39 EST Ventricular Rate:  89 PR Interval:  134 QRS Duration:  82 QT Interval:  350 QTC Calculation: 425 R Axis:   11  Text Interpretation: Normal sinus rhythm Right atrial enlargement Cannot rule out Anterior  infarct , age undetermined Abnormal ECG No previous ECGs available Confirmed by Trine Likes [phone]) on 09/01/2023 11:24:32 PM       Radiology DG Chest 2 View Result Date: 09/01/2023 CLINICAL DATA:  Shortness of breath. EXAM: CHEST - 2 VIEW COMPARISON:  09/06/2020 FINDINGS: The cardiomediastinal contours are normal. Mild bronchial thickening. Pulmonary vasculature is normal. No consolidation, pleural effusion, or pneumothorax. No acute osseous abnormalities are seen. IMPRESSION: Mild bronchial thickening. Electronically Signed   By: Andrea Gasman M.D.   On: 09/01/2023 22:45    Medications Ordered in ED Medications  ipratropium-albuterol  (DUONEB) 0.5-2.5 (3) MG/3ML nebulizer solution 3 mL (3 mLs Nebulization Given 09/01/23 2347)  AeroChamber Plus Flo-Vu Large MISC 1 each (1 each Other Given 09/01/23 2348)  albuterol  (VENTOLIN  HFA) 108 (90 Base) MCG/ACT inhaler 4 puff (4 puffs Inhalation Given 09/01/23 2339)  ipratropium-albuterol  (DUONEB) 0.5-2.5 (3) MG/3ML nebulizer solution 3 mL (3 mLs Nebulization Given 09/02/23 0148)   Procedures Procedures  (including critical care time) Medical Decision Making / ED Course   Medical Decision Making Amount and/or Complexity of Data Reviewed Radiology: ordered.  Risk OTC drugs. Prescription drug management.    Presentation is consistent with bronchitis.  Viral panel negative for COVID, RSV, influenza.  Chest x-ray without evidence of pneumonia.  Treated with DuoNebs which provided the most relief.  Albuterol  puffs through spacer also provided.      Final Clinical Impression(s) / ED Diagnoses Final diagnoses:  Bronchitis   The patient appears reasonably screened and/or stabilized for discharge and I doubt any other medical condition or other [facility] requiring further screening, evaluation, or treatment in the ED at this time. I have discussed the findings, Dx and Tx plan with the patient/family who expressed understanding and agree(s) with the plan.  Discharge instructions discussed at length. The patient/family was given strict return precautions who verbalized understanding of the instructions. No further questions at time of discharge.  Disposition: Discharge  Condition: Good  ED Discharge Orders          Ordered    ipratropium-albuterol  (DUONEB) 0.5-2.5 (3) MG/3ML SOLN  Every 4 hours PRN        09/02/23 0156    azithromycin  (ZITHROMAX  Z-PAK) 250 MG tablet        09/02/23 0156    dextromethorphan-guaiFENesin  (MUCINEX  DM) 30-600 MG 12hr tablet  2 times daily        09/02/23 0156            Follow Up: Primary care provider  Call  to schedule an appointment for close follow up    This chart was dictated using voice recognition software.  Despite best efforts to proofread,  errors can occur which can change the documentation meaning.      Trine Likes Moder, MD 09/02/23 0201

## 2023-09-02 MED ORDER — IPRATROPIUM-ALBUTEROL 0.5-2.5 (3) MG/3ML IN SOLN: 3.0000 mL | RESPIRATORY_TRACT | 0 refills | Status: AC | PRN

## 2023-09-02 MED ORDER — IPRATROPIUM-ALBUTEROL 0.5-2.5 (3) MG/3ML IN SOLN
3.0000 mL | RESPIRATORY_TRACT | Status: AC
  Administered 2023-09-02: 3 mL via RESPIRATORY_TRACT
  Filled 2023-09-02: qty 3

## 2023-09-02 MED ORDER — DM-GUAIFENESIN ER 30-600 MG PO TB12: 1.0000 | ORAL_TABLET | Freq: Two times a day (BID) | ORAL | 0 refills | Status: AC

## 2023-09-02 MED ORDER — AZITHROMYCIN 250 MG PO TABS: ORAL_TABLET | ORAL | 0 refills | Status: DC

## 2023-09-02 NOTE — Patient Instructions (Signed)
 Physician patient to have a flutter valve. Instructed patient on the proper use of using a flutter valve and the frequency in which to use it. Patient was instructed to use flutter valve Q1hr. X 10. Patient was able to demonstrate the use of the flutter valve x 3 before periods of excessive coughing. Patient tolerated OK.

## 2023-10-21 DIAGNOSIS — F3342 Major depressive disorder, recurrent, in full remission: Secondary | ICD-10-CM | POA: Diagnosis not present

## 2023-11-04 DIAGNOSIS — N390 Urinary tract infection, site not specified: Secondary | ICD-10-CM | POA: Diagnosis not present

## 2023-11-04 DIAGNOSIS — B3731 Acute candidiasis of vulva and vagina: Secondary | ICD-10-CM | POA: Diagnosis not present

## 2023-12-13 DIAGNOSIS — Z113 Encounter for screening for infections with a predominantly sexual mode of transmission: Secondary | ICD-10-CM | POA: Diagnosis not present

## 2023-12-17 ENCOUNTER — Other Ambulatory Visit (HOSPITAL_BASED_OUTPATIENT_CLINIC_OR_DEPARTMENT_OTHER): Payer: Self-pay

## 2023-12-17 ENCOUNTER — Ambulatory Visit (HOSPITAL_BASED_OUTPATIENT_CLINIC_OR_DEPARTMENT_OTHER)
Admission: EM | Admit: 2023-12-17 | Discharge: 2023-12-17 | Disposition: A | Discharge status: Home / Self Care | Attending: Emergency Medicine | Admitting: Emergency Medicine

## 2023-12-17 ENCOUNTER — Encounter (HOSPITAL_BASED_OUTPATIENT_CLINIC_OR_DEPARTMENT_OTHER): Payer: Self-pay

## 2023-12-17 DIAGNOSIS — N3001 Acute cystitis with hematuria: Secondary | ICD-10-CM | POA: Insufficient documentation

## 2023-12-17 LAB — POCT URINALYSIS DIP (MANUAL ENTRY)
Glucose, UA: NEGATIVE mg/dL
Nitrite, UA: POSITIVE — AB
Protein Ur, POC: >=300 mg/dL — AB
Spec Grav, UA: 1.02
Urobilinogen, UA: 2 U/dL — AB
pH, UA: 5.5

## 2023-12-17 MED ORDER — PHENAZOPYRIDINE HCL 200 MG PO TABS
ORAL_TABLET | Freq: Three times a day (TID) | ORAL | 0 refills | Status: AC | PRN
  Filled 2023-12-17: qty 10, 4d supply, fill #0
  Filled 2023-12-17: qty 5, 3d supply, fill #0

## 2023-12-17 MED ORDER — SULFAMETHOXAZOLE-TRIMETHOPRIM 800-160 MG PO TABS
1.0000 | ORAL_TABLET | Freq: Two times a day (BID) | ORAL | 0 refills | Status: AC
  Filled 2023-12-17: qty 6, 3d supply, fill #0

## 2023-12-17 NOTE — Discharge Instructions (Addendum)
 Take the antibiotics twice daily with food for the next 3 days.  You can take the Pyridium  up to 3 times daily as needed for burning or discomfort with urination.  These may change the color of your urine to a bright yellow/orange color.  Ensure you are drinking plenty of water to help flush your kidneys.   Symptoms should improve over the next few days.  We are sending your urine off for culture we will contact you if we need to modify your antibiotic therapy.  Return to clinic or follow-up with your primary care provider if no improvement or any new concerning symptoms develop.

## 2023-12-17 NOTE — ED Provider Notes (Signed)
 [facility]    CSN: [id number] Arrival date & time: 12/17/23  1611      History   Chief Complaint Chief Complaint  Patient presents with   Hematuria    HPI Melissa May is a 43 y.o. female.   Patient presents to clinic over concerns of suprapubic pressure, urinary frequency, hematuria and urinating gross clots for the past few hours.  Symptoms happen suddenly and since then she has been urinating frequently, small amounts.  Denies any fevers.  Denies flank pain.  Denies current abdominal pain, nausea or vomiting.  Has not tried any medications or interventions for her symptoms.  The history is provided by the patient and medical records.  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   Elevated WBC count    GERD (gastroesophageal reflux disease)    Hepatocellular adenoma    Obese    TMJ syndrome     Patient Active Problem List   Diagnosis Date Noted   Hepatocellular adenoma    Gastroesophageal reflux disease 02/18/2018   Adenoma of liver 02/08/2016   Rhinitis, allergic 10/06/2015   Recurrent sinusitis 10/06/2015   Asthma 11/11/2014   H/O: depression 04/19/2014   Cesarean delivery delivered 04/16/2014   Hypermenorrhea 12/21/2012   Obesity (BMI 35.0-39.9 without comorbidity) 05/20/2012    Past Surgical History:  Procedure Laterality Date   CESAREAN SECTION N/A 04/16/2014   Procedure: CESAREAN SECTION;  Surgeon: Shasta Deist, MD;  Location: [facility];  Service: Obstetrics;  Laterality: N/A;   DORSAL COMPARTMENT RELEASE Left 04/22/2015   Procedure: LEFT WRIST DEQUERVAIN'S RELEASE;  Surgeon: Rober Chimera, MD;  Location: [facility];  Service: Orthopedics;  Laterality: Left;   STRABISMUS SURGERY     as an infant   STRABISMUS SURGERY Right 06/22/2014   Procedure: REPAIR STRABISMUS RIGHT EYE;   Surgeon: Jorene New, MD;  Location: [facility];  Service: Ophthalmology;  Laterality: Right;   UMBILICAL HERNIA REPAIR  03/2019   UPPER GASTROINTESTINAL ENDOSCOPY  2009   WISDOM TOOTH EXTRACTION      OB History     Gravida  2   Para  1   Term  1   Preterm      AB      Living  1      SAB      IAB      Ectopic      Multiple      Live Births  1            Home Medications    Prior to Admission medications   Medication Sig Start Date End Date Taking? Authorizing Provider  phenazopyridine  (PYRIDIUM ) 100 MG tablet Take 1 tablet (100 mg total) by mouth 3 (three) times daily as needed for pain. 12/17/23  Yes Maddux First  N, FNP  sulfamethoxazole -trimethoprim  (BACTRIM  DS) 800-160 MG tablet Take 1 tablet by mouth 2 (two) times daily for 3 days. 12/17/23 12/20/23 Yes Samaj Wessells  N, FNP  albuterol  (VENTOLIN  HFA) 108 (90 Base) MCG/ACT inhaler Inhale 2 puffs into the lungs every 6 (six) hours as needed for wheezing or shortness of breath.    [provider]  ALPRAZolam (XANAX) 0.5 MG tablet Take 0.5 mg by mouth at bedtime as needed for anxiety.    [provider]  azithromycin  (ZITHROMAX  Z-PAK) 250 MG tablet Take 500 mg on day 1, then 250 mg once a day for days 2, 3, 4, and 5. 09/02/23   Cardama, Camila Cecil, MD  dicyclomine  (BENTYL ) 20 MG tablet Take 1 tablet (20 mg total) by mouth 3 (three) times daily as needed for spasms (abdominal cramping). Patient not taking: Reported on 04/14/2021 02/08/20   Roberts Ching, MD  FLUoxetine  (PROZAC ) 40 MG capsule Take 80 mg by mouth at bedtime.     [provider]  ipratropium-albuterol  (DUONEB) 0.5-2.5 (3) MG/3ML SOLN Take 3 mLs by nebulization every 4 (four) hours as needed for up to 15 days. 09/02/23 09/17/23  Lindle Rhea, MD  meloxicam (MOBIC) 15 MG tablet Take 15 mg by mouth once. Patient not taking: Reported on 04/14/2021    [provider]  montelukast  (SINGULAIR )  10 MG tablet Take 10 mg by mouth at bedtime.    [provider]  naproxen  (NAPROSYN ) 500 MG tablet Take 1 tablet (500 mg total) by mouth 2 (two) times daily. Patient not taking: Reported on 04/14/2021 09/06/20   Wieters, Hallie C, PA-C  omeprazole (PRILOSEC) 20 MG capsule Take 20 mg by mouth at bedtime. 05/10/17   [provider]  ondansetron  (ZOFRAN  ODT) 4 MG disintegrating tablet Take 1 tablet (4 mg total) by mouth every 8 (eight) hours as needed. Patient not taking: Reported on 04/14/2021 02/08/20   Long, Shereen Dike, MD  ondansetron  (ZOFRAN -ODT) 4 MG disintegrating tablet Take 1 tablet (4 mg total) by mouth every 6 (six) hours as needed for nausea or vomiting. 11/08/22   Baxter Limber A, PA-C  traZODone (DESYREL) 50 MG tablet  04/15/19   [provider]    Family History Family History  Problem Relation Age of Onset   Cancer Father    Kidney cancer Father    Hypertension Father    Heart disease Maternal Grandfather    Rheum arthritis Mother    Asthma Paternal Grandmother    Diabetes Paternal Grandmother    Hypertension Paternal Grandmother    Allergies Brother    Rheum arthritis Maternal Grandmother    Stroke Maternal Grandmother     Social History Social History   Tobacco Use   Smoking status: Never   Smokeless tobacco: Never  Vaping Use   Vaping status: Never Used  Substance Use Topics   Alcohol use: Yes    Comment: occ   Drug use: No     Allergies   Lexapro [escitalopram oxalate], Pristiq [desvenlafaxine succinate er], Adhesive [tape], and Morphine  and codeine   Review of Systems Review of Systems  Per HPI  Physical Exam Triage Vital Signs ED Triage Vitals  Encounter Vitals Group     BP 12/17/23 1653 124/79     Systolic BP Percentile --      Diastolic BP Percentile --      Pulse Rate 12/17/23 1653 94     Resp 12/17/23 1653 16     Temp 12/17/23 1653 99 F (37.2 C)     Temp Source 12/17/23 1653 Oral     SpO2 12/17/23 1653 95 %      Weight --      Height --      Head Circumference --      Peak Flow --      Pain Score 12/17/23 1636 3     Pain Loc --      Pain Education --      Exclude  from Growth Chart --    No data found.  Updated Vital Signs BP 124/79 (BP Location: Right Arm)   Pulse 94   Temp 99 F (37.2 C) (Oral)   Resp 16   LMP 12/10/2023 (Approximate)   SpO2 95%   Breastfeeding No   Visual Acuity Right Eye Distance:   Left Eye Distance:   Bilateral Distance:    Right Eye Near:   Left Eye Near:    Bilateral Near:     Physical Exam Vitals and nursing note reviewed.  Constitutional:      Appearance: Normal appearance.  HENT:     Head: Normocephalic and atraumatic.     Right Ear: External ear normal.     Left Ear: External ear normal.     Nose: Nose normal.     Mouth/Throat:     Mouth: Mucous membranes are moist.  Eyes:     Conjunctiva/sclera: Conjunctivae normal.  Cardiovascular:     Rate and Rhythm: Normal rate.  Pulmonary:     Effort: Pulmonary effort is normal. No respiratory distress.  Abdominal:     Tenderness: There is no right CVA tenderness or left CVA tenderness.  Neurological:     General: No focal deficit present.     Mental Status: She is alert.  Psychiatric:        Mood and Affect: Mood normal.      UC Treatments / Results  Labs (all labs ordered are listed, but only abnormal results are displayed) Labs Reviewed  POCT URINALYSIS DIP (MANUAL ENTRY) - Abnormal; Notable for the following components:      Result Value   Color, UA red (*)    Bilirubin, UA moderate (*)    Ketones, POC UA small (15) (*)    Blood, UA large (*)    Protein Ur, POC >=300 (*)    Urobilinogen, UA 2.0 (*)    Nitrite, UA Positive (*)    Leukocytes, UA Large (3+) (*)    All other components within normal limits  URINE CULTURE    EKG   Radiology No results found.  Procedures Procedures (including critical care time)  Medications Ordered in UC Medications - No data to  display  Initial Impression / Assessment and Plan / UC Course  I have reviewed the triage vital signs and the nursing notes.  Pertinent labs & imaging results that were available during my care of the patient were reviewed by me and considered in my medical decision making (see chart for details).  Vitals in triage reviewed, patient is hemodynamically stable.  Urine sample with bilirubin, ketones, red blood cells, protein, nitrates and leukocytes.  Concerning for acute cystitis with hematuria, will send urine for culture.  Without fever, tachycardia or CVA tenderness, low concern for pyelonephritis at this time.  Will treat with Bactrim  twice daily x 3 days.  Symptomatic management with Azo.  Plan of care, follow-up care return precautions given, no questions at this time.     Final Clinical Impressions(s) / UC Diagnoses   Final diagnoses:  Acute cystitis with hematuria     Discharge Instructions      Take the antibiotics twice daily with food for the next 3 days.  You can take the Pyridium  up to 3 times daily as needed for burning or discomfort with urination.  These may change the color of your urine to a bright yellow/orange color.  Ensure you are drinking plenty of water to help flush your kidneys.   Symptoms  should improve over the next few days.  We are sending your urine off for culture we will contact you if we need to modify your antibiotic therapy.  Return to clinic or follow-up with your primary care provider if no improvement or any new concerning symptoms develop.     ED Prescriptions     Medication Sig Dispense Auth. Provider   sulfamethoxazole -trimethoprim  (BACTRIM  DS) 800-160 MG tablet Take 1 tablet by mouth 2 (two) times daily for 3 days. 6 tablet Harlow Lighter, Haydn Hutsell  N, FNP   phenazopyridine  (PYRIDIUM ) 100 MG tablet Take 1 tablet (100 mg total) by mouth 3 (three) times daily as needed for pain. 10 tablet Pedro Bourgeois, FNP      PDMP not reviewed this  encounter.   Harlow Lighter, Darrell Hauk  N, FNP 12/17/23 1704

## 2023-12-17 NOTE — ED Triage Notes (Signed)
 Patient presents to UC for suprapubic pain, urinary freq, hematuria and has noted blood clots since about 2 hrs ago. No OTC pain meds for symptoms.

## 2023-12-19 LAB — URINE CULTURE: Culture: 50000 — AB

## 2023-12-20 DIAGNOSIS — A64 Unspecified sexually transmitted disease: Secondary | ICD-10-CM | POA: Diagnosis not present

## 2023-12-20 DIAGNOSIS — R350 Frequency of micturition: Secondary | ICD-10-CM | POA: Diagnosis not present

## 2023-12-20 DIAGNOSIS — N760 Acute vaginitis: Secondary | ICD-10-CM | POA: Diagnosis not present

## 2024-01-17 DIAGNOSIS — I10 Essential (primary) hypertension: Secondary | ICD-10-CM | POA: Diagnosis not present

## 2024-01-17 DIAGNOSIS — K219 Gastro-esophageal reflux disease without esophagitis: Secondary | ICD-10-CM | POA: Diagnosis not present

## 2024-01-17 DIAGNOSIS — Z13220 Encounter for screening for lipoid disorders: Secondary | ICD-10-CM | POA: Diagnosis not present

## 2024-01-17 DIAGNOSIS — Z131 Encounter for screening for diabetes mellitus: Secondary | ICD-10-CM | POA: Diagnosis not present

## 2024-01-17 DIAGNOSIS — R635 Abnormal weight gain: Secondary | ICD-10-CM | POA: Diagnosis not present

## 2024-02-18 DIAGNOSIS — Z6841 Body Mass Index (BMI) 40.0 and over, adult: Secondary | ICD-10-CM | POA: Diagnosis not present

## 2024-02-18 DIAGNOSIS — E66813 Obesity, class 3: Secondary | ICD-10-CM | POA: Diagnosis not present

## 2024-04-03 DIAGNOSIS — R109 Unspecified abdominal pain: Secondary | ICD-10-CM | POA: Diagnosis not present

## 2024-04-03 DIAGNOSIS — Z6841 Body Mass Index (BMI) 40.0 and over, adult: Secondary | ICD-10-CM | POA: Diagnosis not present

## 2024-04-03 DIAGNOSIS — R197 Diarrhea, unspecified: Secondary | ICD-10-CM | POA: Diagnosis not present

## 2024-04-03 DIAGNOSIS — R110 Nausea: Secondary | ICD-10-CM | POA: Diagnosis not present

## 2024-04-03 DIAGNOSIS — R8290 Unspecified abnormal findings in urine: Secondary | ICD-10-CM | POA: Diagnosis not present

## 2024-04-17 DIAGNOSIS — R6889 Other general symptoms and signs: Secondary | ICD-10-CM | POA: Diagnosis not present

## 2024-04-17 DIAGNOSIS — E66813 Obesity, class 3: Secondary | ICD-10-CM | POA: Diagnosis not present

## 2024-04-17 DIAGNOSIS — I10 Essential (primary) hypertension: Secondary | ICD-10-CM | POA: Diagnosis not present

## 2024-04-17 DIAGNOSIS — Z6841 Body Mass Index (BMI) 40.0 and over, adult: Secondary | ICD-10-CM | POA: Diagnosis not present

## 2024-05-11 DIAGNOSIS — E66813 Obesity, class 3: Secondary | ICD-10-CM | POA: Diagnosis not present

## 2024-05-11 DIAGNOSIS — Z6841 Body Mass Index (BMI) 40.0 and over, adult: Secondary | ICD-10-CM | POA: Diagnosis not present

## 2024-05-11 DIAGNOSIS — J329 Chronic sinusitis, unspecified: Secondary | ICD-10-CM | POA: Diagnosis not present

## 2024-05-16 DIAGNOSIS — Z6841 Body Mass Index (BMI) 40.0 and over, adult: Secondary | ICD-10-CM | POA: Diagnosis not present

## 2024-05-16 DIAGNOSIS — J329 Chronic sinusitis, unspecified: Secondary | ICD-10-CM | POA: Diagnosis not present

## 2024-05-16 DIAGNOSIS — E66813 Obesity, class 3: Secondary | ICD-10-CM | POA: Diagnosis not present

## 2024-06-20 DIAGNOSIS — A64 Unspecified sexually transmitted disease: Secondary | ICD-10-CM | POA: Diagnosis not present

## 2024-06-20 DIAGNOSIS — Z202 Contact with and (suspected) exposure to infections with a predominantly sexual mode of transmission: Secondary | ICD-10-CM | POA: Diagnosis not present

## 2024-07-21 DIAGNOSIS — E785 Hyperlipidemia, unspecified: Secondary | ICD-10-CM | POA: Diagnosis not present

## 2024-07-21 DIAGNOSIS — D72829 Elevated white blood cell count, unspecified: Secondary | ICD-10-CM | POA: Diagnosis not present

## 2024-07-21 DIAGNOSIS — R7303 Prediabetes: Secondary | ICD-10-CM | POA: Diagnosis not present

## 2024-07-21 DIAGNOSIS — I10 Essential (primary) hypertension: Secondary | ICD-10-CM | POA: Diagnosis not present

## 2024-07-26 DIAGNOSIS — Z6841 Body Mass Index (BMI) 40.0 and over, adult: Secondary | ICD-10-CM | POA: Diagnosis not present

## 2024-07-26 DIAGNOSIS — R3915 Urgency of urination: Secondary | ICD-10-CM | POA: Diagnosis not present

## 2024-07-26 DIAGNOSIS — N390 Urinary tract infection, site not specified: Secondary | ICD-10-CM | POA: Diagnosis not present

## 2024-08-03 ENCOUNTER — Telehealth (HOSPITAL_BASED_OUTPATIENT_CLINIC_OR_DEPARTMENT_OTHER): Payer: Self-pay

## 2024-08-03 NOTE — Telephone Encounter (Signed)
 Left a message regarding rescheduling her new patient appointment due to conflict in schedule. I asked patient to please give us  a call back to reschedule.

## 2024-08-22 DIAGNOSIS — H5213 Myopia, bilateral: Secondary | ICD-10-CM | POA: Diagnosis not present

## 2024-08-29 ENCOUNTER — Other Ambulatory Visit (HOSPITAL_COMMUNITY)
Admission: RE | Admit: 2024-08-29 | Discharge: 2024-08-29 | Disposition: A | Discharge status: Home / Self Care | Source: Ambulatory Visit | Attending: Certified Nurse Midwife | Admitting: Certified Nurse Midwife

## 2024-08-29 ENCOUNTER — Ambulatory Visit (HOSPITAL_BASED_OUTPATIENT_CLINIC_OR_DEPARTMENT_OTHER): Admitting: Certified Nurse Midwife

## 2024-08-29 ENCOUNTER — Encounter (HOSPITAL_BASED_OUTPATIENT_CLINIC_OR_DEPARTMENT_OTHER): Payer: Self-pay | Admitting: Certified Nurse Midwife

## 2024-08-29 ENCOUNTER — Encounter (HOSPITAL_BASED_OUTPATIENT_CLINIC_OR_DEPARTMENT_OTHER): Admitting: Certified Nurse Midwife

## 2024-08-29 VITALS — BP 145/82 | HR 75 | Ht 62.0 in | Wt 226.4 lb

## 2024-08-29 DIAGNOSIS — Z6841 Body Mass Index (BMI) 40.0 and over, adult: Secondary | ICD-10-CM

## 2024-08-29 DIAGNOSIS — Z1231 Encounter for screening mammogram for malignant neoplasm of breast: Secondary | ICD-10-CM

## 2024-08-29 DIAGNOSIS — Z124 Encounter for screening for malignant neoplasm of cervix: Secondary | ICD-10-CM

## 2024-08-29 DIAGNOSIS — Z113 Encounter for screening for infections with a predominantly sexual mode of transmission: Secondary | ICD-10-CM | POA: Insufficient documentation

## 2024-08-29 DIAGNOSIS — Z30431 Encounter for routine checking of intrauterine contraceptive device: Secondary | ICD-10-CM | POA: Insufficient documentation

## 2024-08-29 DIAGNOSIS — Z1331 Encounter for screening for depression: Secondary | ICD-10-CM | POA: Diagnosis not present

## 2024-08-29 DIAGNOSIS — Z01419 Encounter for gynecological examination (general) (routine) without abnormal findings: Secondary | ICD-10-CM

## 2024-08-29 NOTE — Progress Notes (Signed)
 43 y.o. G2P1001 Legally Separated White or Caucasian female here for annual exam.  She is sexually active. Vasectomy/Copper  IUD contraception. She has a healthy 40year old son. She does not desire future childbearing. She would like to RTO to discuss bilateral salpingectomy with Dr. Cleotilde (appointment made). Pt would also like to consider endometrial ablation at time of Bilateral Salpingectomy if possible.   Patient's last menstrual period was 08/23/2024.          Sexually active: Yes.    The current method of family planning is Copper  IUD/Vasectomy. Exercising: Yes.    Walking the dog Smoker:  no  Health Maintenance: Pap:  04/15/2017 History of abnormal Pap:  no MMG:  No prior mammograms. Mammogram discussed/ordered. Colonoscopy:  Discussed recommendation Colonoscopy at age 66 (unless change in Family Hx)    reports that she has never smoked. She has never used smokeless tobacco. She reports current alcohol use. She reports that she does not use drugs.  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   Elevated WBC count    GERD (gastroesophageal reflux disease)    Hepatocellular adenoma    Obese    TMJ syndrome     Past Surgical History:  Procedure Laterality Date   CESAREAN SECTION N/A 04/16/2014   Procedure: CESAREAN SECTION;  Surgeon: Robbi JONELLE Render, MD;  Location: [facility];  Service: Obstetrics;  Laterality: N/A;   DORSAL COMPARTMENT RELEASE Left 04/22/2015   Procedure: LEFT WRIST DEQUERVAIN'S RELEASE;  Surgeon: Alm Hummer, MD;  Location: [facility];  Service: Orthopedics;  Laterality: Left;   STRABISMUS SURGERY     as an infant   STRABISMUS SURGERY Right 06/22/2014   Procedure: REPAIR STRABISMUS RIGHT EYE;  Surgeon: Elsie MALVA Salt, MD;  Location: [facility];  Service: Ophthalmology;  Laterality: Right;   UMBILICAL  HERNIA REPAIR  03/2019   UPPER GASTROINTESTINAL ENDOSCOPY  2009   WISDOM TOOTH EXTRACTION      Current Outpatient Medications  Medication Sig Dispense Refill   albuterol  (VENTOLIN  HFA) 108 (90 Base) MCG/ACT inhaler Inhale 2 puffs into the lungs every 6 (six) hours as needed for wheezing or shortness of breath.     ALPRAZolam (XANAX) 0.5 MG tablet Take 0.5 mg by mouth at bedtime as needed for anxiety.     FLUoxetine  (PROZAC ) 40 MG capsule Take 80 mg by mouth at bedtime.      ipratropium-albuterol  (DUONEB) 0.5-2.5 (3) MG/3ML SOLN Take 3 mLs by nebulization every 4 (four) hours as needed for up to 15 days. 270 mL 0   montelukast  (SINGULAIR ) 10 MG tablet Take 10 mg by mouth at bedtime.     omeprazole (PRILOSEC) 20 MG capsule Take 20 mg by mouth at bedtime.  0   ondansetron  (ZOFRAN  ODT) 4 MG disintegrating tablet Take 1 tablet (4 mg total) by mouth every 8 (eight) hours as needed. 20 tablet 0   ondansetron  (ZOFRAN -ODT) 4 MG disintegrating tablet Take 1 tablet (4 mg total) by mouth every 6 (six) hours as needed for nausea or vomiting. 15 tablet 0   traZODone (DESYREL) 50 MG tablet      WEGOVY 1 MG/0.5ML SOAJ SQ injection Inject 1 mg into the skin once a week.     azithromycin  (ZITHROMAX  Z-PAK) 250 MG tablet Take 500 mg on day 1, then 250  mg once a day for days 2, 3, 4, and 5. (Patient not taking: Reported on 08/29/2024) 6 tablet 0   dicyclomine  (BENTYL ) 20 MG tablet Take 1 tablet (20 mg total) by mouth 3 (three) times daily as needed for spasms (abdominal cramping). (Patient not taking: Reported on 08/29/2024) 20 tablet 0   meloxicam (MOBIC) 15 MG tablet Take 15 mg by mouth once. (Patient not taking: Reported on 08/29/2024)     naproxen  (NAPROSYN ) 500 MG tablet Take 1 tablet (500 mg total) by mouth 2 (two) times daily. (Patient not taking: Reported on 08/29/2024) 30 tablet 0   phenazopyridine  (PYRIDIUM ) 200 MG tablet Take 1/2 tablet (100mg ) by mouth 3 (three) times daily as needed for pain. (Patient  not taking: Reported on 08/29/2024) 5 tablet 0   No current facility-administered medications for this visit.    Family History  Problem Relation Age of Onset   Cancer Father    Kidney cancer Father    Hypertension Father    Heart disease Maternal Grandfather    Rheum arthritis Mother    Asthma Paternal Grandmother    Diabetes Paternal Grandmother    Hypertension Paternal Grandmother    Allergies Brother    Rheum arthritis Maternal Grandmother    Stroke Maternal Grandmother     ROS: Constitutional: negative Genitourinary:negative  Exam:   BP (!) 145/82   Pulse 75   Ht 5' 2 (1.575 m) Comment: Reported  Wt 226 lb 6.4 oz (102.7 kg)   LMP 08/23/2024   BMI 41.41 kg/m   Height: 5' 2 (157.5 cm) (Reported)  General appearance: alert, cooperative and appears stated age Head: Normocephalic, without obvious abnormality, atraumatic Breasts: normal appearance, no masses or tenderness, Inspection negative, No nipple retraction or dimpling, No nipple discharge or bleeding, No axillary or supraclavicular adenopathy, Normal to palpation without dominant masses, Taught monthly breast self examination Heart: regular rate and rhythm Abdomen: soft, non-tender; bowel sounds normal; no masses,  no organomegaly Extremities: extremities normal, atraumatic, no cyanosis or edema Skin: Skin color, texture, turgor normal. No rashes or lesions Lymph nodes: Cervical, supraclavicular, and axillary nodes normal. No abnormal inguinal nodes palpated Neurologic: Grossly normal   Pelvic: External genitalia:  no lesions              Urethra:  normal appearing urethra with no masses, tenderness or lesions              Bartholins and Skenes: normal                 Vagina: normal appearing vagina with normal color and no discharge, no lesions              Cervix: no bleeding following Pap, no cervical motion tenderness, and no lesions IUD strings palpable (short) but not visible.              Pap taken:  Yes.   Bimanual Exam:  Uterus:  normal size, contour, position, consistency, mobility, non-tender              Adnexa: no mass, fullness, tenderness              Anus:  normal sphincter tone, no lesions  Chaperone,  CMA, was present for exam.  Assessment/Plan:  1. Encounter for annual routine gynecological examination - Discussed recommendation for annual screening mammograms and self breast awareness - Discussed recommendation for Colonoscopy at age 76 to screen for Colon Ca and sooner if Family Hx should change  2. Cervical cancer screening (Primary) - Routine pap smear collected  3. Screen for STD (sexually transmitted disease) - pt desired routine GC/CT/TV on routine pap smear  4. Encounter for screening mammogram for malignant neoplasm of breast  5. Pt desires permanent female sterilization - Has Copper  IUD currently but will RTO to discuss permanent female sterilization with MD  Pt will RTO to discuss Bilateral Salpingectomy (and possible endometrial ablation) with Dr. Cleotilde (appt scheduled for 09/19/24). Arland MARLA Roller

## 2024-09-04 LAB — CYTOLOGY - PAP
Chlamydia: POSITIVE — AB
Comment: NEGATIVE
Comment: NEGATIVE
Comment: NEGATIVE
Comment: NEGATIVE
Comment: NEGATIVE
Comment: NORMAL
Diagnosis: UNDETERMINED — AB
HPV 16: NEGATIVE
HPV 18 / 45: NEGATIVE
High risk HPV: POSITIVE — AB
Neisseria Gonorrhea: NEGATIVE
Trichomonas: NEGATIVE

## 2024-09-05 ENCOUNTER — Ambulatory Visit (HOSPITAL_BASED_OUTPATIENT_CLINIC_OR_DEPARTMENT_OTHER): Payer: Self-pay | Admitting: Obstetrics & Gynecology

## 2024-09-06 MED ORDER — DOXYCYCLINE HYCLATE 100 MG PO TABS: 100.0000 mg | ORAL_TABLET | Freq: Two times a day (BID) | ORAL | 0 refills | Status: AC

## 2024-09-15 ENCOUNTER — Ambulatory Visit

## 2024-09-19 ENCOUNTER — Ambulatory Visit (HOSPITAL_BASED_OUTPATIENT_CLINIC_OR_DEPARTMENT_OTHER): Payer: Self-pay | Admitting: Obstetrics & Gynecology

## 2024-09-19 ENCOUNTER — Other Ambulatory Visit (HOSPITAL_COMMUNITY)
Admission: RE | Admit: 2024-09-19 | Discharge: 2024-09-19 | Disposition: A | Source: Ambulatory Visit | Attending: Obstetrics & Gynecology | Admitting: Obstetrics & Gynecology

## 2024-09-19 ENCOUNTER — Other Ambulatory Visit (HOSPITAL_COMMUNITY)
Admission: RE | Admit: 2024-09-19 | Discharge: 2024-09-19 | Disposition: A | Discharge status: Home / Self Care | Source: Ambulatory Visit | Attending: Obstetrics & Gynecology | Admitting: Obstetrics & Gynecology

## 2024-09-19 ENCOUNTER — Encounter (HOSPITAL_BASED_OUTPATIENT_CLINIC_OR_DEPARTMENT_OTHER): Payer: Self-pay | Admitting: Obstetrics & Gynecology

## 2024-09-19 VITALS — BP 136/78 | HR 87 | Ht 62.0 in | Wt 228.6 lb

## 2024-09-19 DIAGNOSIS — R87810 Cervical high risk human papillomavirus (HPV) DNA test positive: Secondary | ICD-10-CM | POA: Diagnosis present

## 2024-09-19 DIAGNOSIS — R87610 Atypical squamous cells of undetermined significance on cytologic smear of cervix (ASC-US): Secondary | ICD-10-CM

## 2024-09-19 DIAGNOSIS — Z8619 Personal history of other infectious and parasitic diseases: Secondary | ICD-10-CM

## 2024-09-19 DIAGNOSIS — Z975 Presence of (intrauterine) contraceptive device: Secondary | ICD-10-CM

## 2024-09-19 DIAGNOSIS — N921 Excessive and frequent menstruation with irregular cycle: Secondary | ICD-10-CM

## 2024-09-19 NOTE — Progress Notes (Unsigned)
" ° °  CC:  colposcopy, h/o abnormal pap smear  44 y.o. G2P1001 Legally Separated Not Hispanic or Latino female here for colposcopy with possible biopsies and/or ECC due to ASCUS w/HRHPV not specified. Pap obtained 08/29/2024.    Prior evaluation/treatment:  ***.  LMP: 09/15/2024          Sexually active: Yes.    The current method of family planning is IUD.     Patient has been counseled about results and procedure.  Risks and benefits have bene reviewed including immediate and/or delayed bleeding, infection, cervical scaring from procedure, possibility of needing additional follow up as well as treatment.  Rare risks of missing a lesion discussed as well.  All questions answered.  Pt ready to proceed.  Consent obtained.  LMP 08/23/2024   General appearance: alert, cooperative and appears stated age Lymph nodes: No abnormal inguinal nodes palpated Neurologic: Grossly normal  Pelvic: External genitalia:  no lesions              Urethra:  normal appearing urethra with no masses, tenderness or lesions              Bartholins and Skenes: normal                 Vagina: normal appearing vagina with normal color and no discharge, no lesions               Physical Exam  Speculum placed.  3% acetic acid applied to cervix for >45 seconds.  Cervix visualized with both 7.5X and 15X magnification.  Green filter also used.  Lugols solution {WAS/WAS NOT:[id number]::was not} used.  Findings:  ***.  Biopsy:  ***.  ECC:  {WAS/WAS NOT:[id number]::was not} performed.  Monsel's {WAS/WAS NOT:[id number]::was not} needed.  Excellent hemostasis was present.  Pt tolerated procedure well and all instruments were removed.  ***Findings noted above on picture of cervix.  Chaperone, ***, was present during procedure.  Assessment/Plan:  - Pathology results will be called to patient and follow-up planned pending results. "

## 2024-09-20 LAB — CERVICOVAGINAL ANCILLARY ONLY
Chlamydia: NEGATIVE
Comment: NEGATIVE
Comment: NORMAL
Neisseria Gonorrhea: NEGATIVE

## 2024-09-21 LAB — SURGICAL PATHOLOGY

## 2024-09-22 ENCOUNTER — Ambulatory Visit (HOSPITAL_BASED_OUTPATIENT_CLINIC_OR_DEPARTMENT_OTHER): Payer: Self-pay | Admitting: Obstetrics & Gynecology

## 2024-09-27 ENCOUNTER — Ambulatory Visit
Admission: RE | Admit: 2024-09-27 | Discharge: 2024-09-27 | Disposition: A | Source: Ambulatory Visit | Attending: Certified Nurse Midwife | Admitting: Certified Nurse Midwife

## 2024-09-27 DIAGNOSIS — Z1231 Encounter for screening mammogram for malignant neoplasm of breast: Secondary | ICD-10-CM

## 2024-10-03 ENCOUNTER — Other Ambulatory Visit: Payer: Self-pay | Admitting: Certified Nurse Midwife

## 2024-10-03 DIAGNOSIS — R928 Other abnormal and inconclusive findings on diagnostic imaging of breast: Secondary | ICD-10-CM

## 2024-10-10 ENCOUNTER — Encounter (HOSPITAL_BASED_OUTPATIENT_CLINIC_OR_DEPARTMENT_OTHER): Admitting: Obstetrics and Gynecology

## 2024-10-16 ENCOUNTER — Other Ambulatory Visit

## 2024-10-16 ENCOUNTER — Encounter

## 2024-10-25 ENCOUNTER — Other Ambulatory Visit (HOSPITAL_BASED_OUTPATIENT_CLINIC_OR_DEPARTMENT_OTHER)

## 2024-10-25 ENCOUNTER — Ambulatory Visit (HOSPITAL_BASED_OUTPATIENT_CLINIC_OR_DEPARTMENT_OTHER): Payer: Self-pay | Admitting: Obstetrics & Gynecology

## 2024-12-18 ENCOUNTER — Ambulatory Visit (HOSPITAL_BASED_OUTPATIENT_CLINIC_OR_DEPARTMENT_OTHER): Payer: Self-pay

## 2025-09-25 ENCOUNTER — Ambulatory Visit (HOSPITAL_BASED_OUTPATIENT_CLINIC_OR_DEPARTMENT_OTHER): Payer: Self-pay | Admitting: Obstetrics & Gynecology
